# Patient Record
Sex: Female | Born: 1935 | Race: Black or African American | Hispanic: No | Smoking: Never smoker
Health system: Southern US, Community
[De-identification: ages and names within clinical notes are randomized; demographics above are authoritative.]

## PROBLEM LIST (undated history)

## (undated) DIAGNOSIS — I10 Essential (primary) hypertension: Secondary | ICD-10-CM

## (undated) DIAGNOSIS — K219 Gastro-esophageal reflux disease without esophagitis: Secondary | ICD-10-CM

## (undated) DIAGNOSIS — E11319 Type 2 diabetes mellitus with unspecified diabetic retinopathy without macular edema: Secondary | ICD-10-CM

## (undated) DIAGNOSIS — H35039 Hypertensive retinopathy, unspecified eye: Secondary | ICD-10-CM

## (undated) DIAGNOSIS — E119 Type 2 diabetes mellitus without complications: Secondary | ICD-10-CM

## (undated) HISTORY — DX: Hypertensive retinopathy, unspecified eye: H35.039

## (undated) HISTORY — DX: Type 2 diabetes mellitus with unspecified diabetic retinopathy without macular edema: E11.319

## (undated) HISTORY — PX: EYE SURGERY: SHX253

## (undated) HISTORY — PX: CATARACT EXTRACTION: SUR2

## (undated) HISTORY — PX: APPENDECTOMY: SHX54

---

## 2001-03-09 ENCOUNTER — Other Ambulatory Visit: Admission: RE | Admit: 2001-03-09 | Discharge: 2001-03-09 | Payer: Self-pay | Admitting: Obstetrics and Gynecology

## 2001-03-24 ENCOUNTER — Encounter: Payer: Self-pay | Admitting: Obstetrics and Gynecology

## 2001-03-24 ENCOUNTER — Ambulatory Visit (HOSPITAL_COMMUNITY): Admission: RE | Admit: 2001-03-24 | Discharge: 2001-03-24 | Payer: Self-pay | Admitting: Obstetrics and Gynecology

## 2002-09-06 ENCOUNTER — Encounter: Payer: Self-pay | Admitting: Obstetrics and Gynecology

## 2002-09-06 ENCOUNTER — Ambulatory Visit (HOSPITAL_COMMUNITY): Admission: RE | Admit: 2002-09-06 | Discharge: 2002-09-06 | Payer: Self-pay | Admitting: Obstetrics and Gynecology

## 2002-10-22 ENCOUNTER — Other Ambulatory Visit: Admission: RE | Admit: 2002-10-22 | Discharge: 2002-10-22 | Payer: Self-pay | Admitting: Obstetrics and Gynecology

## 2003-11-09 HISTORY — PX: COLONOSCOPY: SHX174

## 2003-12-03 ENCOUNTER — Emergency Department (HOSPITAL_COMMUNITY): Admission: EM | Admit: 2003-12-03 | Discharge: 2003-12-03 | Payer: Self-pay | Admitting: *Deleted

## 2004-01-17 ENCOUNTER — Ambulatory Visit (HOSPITAL_COMMUNITY): Admission: RE | Admit: 2004-01-17 | Discharge: 2004-01-17 | Payer: Self-pay | Admitting: Gastroenterology

## 2005-03-15 ENCOUNTER — Emergency Department (HOSPITAL_COMMUNITY): Admission: EM | Admit: 2005-03-15 | Discharge: 2005-03-16 | Payer: Self-pay | Admitting: Emergency Medicine

## 2006-04-14 ENCOUNTER — Ambulatory Visit (HOSPITAL_COMMUNITY): Admission: RE | Admit: 2006-04-14 | Discharge: 2006-04-14 | Payer: Self-pay | Admitting: Cardiovascular Disease

## 2007-05-08 ENCOUNTER — Ambulatory Visit (HOSPITAL_COMMUNITY): Admission: RE | Admit: 2007-05-08 | Discharge: 2007-05-08 | Payer: Self-pay | Admitting: Cardiovascular Disease

## 2008-04-08 ENCOUNTER — Encounter: Admission: RE | Admit: 2008-04-08 | Discharge: 2008-04-08 | Payer: Self-pay | Admitting: Cardiovascular Disease

## 2008-05-08 ENCOUNTER — Ambulatory Visit (HOSPITAL_COMMUNITY): Admission: RE | Admit: 2008-05-08 | Discharge: 2008-05-08 | Payer: Self-pay | Admitting: Cardiovascular Disease

## 2009-02-10 ENCOUNTER — Encounter: Admission: RE | Admit: 2009-02-10 | Discharge: 2009-02-10 | Payer: Self-pay | Admitting: Cardiovascular Disease

## 2009-05-14 ENCOUNTER — Ambulatory Visit (HOSPITAL_COMMUNITY): Admission: RE | Admit: 2009-05-14 | Discharge: 2009-05-14 | Payer: Self-pay | Admitting: Cardiovascular Disease

## 2009-10-24 ENCOUNTER — Inpatient Hospital Stay (HOSPITAL_COMMUNITY): Admission: EM | Admit: 2009-10-24 | Discharge: 2009-10-27 | Payer: Self-pay | Admitting: Emergency Medicine

## 2010-06-01 ENCOUNTER — Ambulatory Visit (HOSPITAL_COMMUNITY): Admission: RE | Admit: 2010-06-01 | Discharge: 2010-06-01 | Payer: Self-pay | Admitting: Cardiovascular Disease

## 2011-02-08 LAB — RETICULOCYTES: Retic Ct Pct: 1.1 % (ref 0.4–3.1)

## 2011-02-08 LAB — CBC
HCT: 27.5 % — ABNORMAL LOW (ref 36.0–46.0)
HCT: 29.2 % — ABNORMAL LOW (ref 36.0–46.0)
Hemoglobin: 9.5 g/dL — ABNORMAL LOW (ref 12.0–15.0)
MCHC: 33.8 g/dL (ref 30.0–36.0)
MCV: 85.6 fL (ref 78.0–100.0)
Platelets: 189 10*3/uL (ref 150–400)
Platelets: 197 10*3/uL (ref 150–400)
RBC: 3.21 MIL/uL — ABNORMAL LOW (ref 3.87–5.11)
RBC: 3.31 MIL/uL — ABNORMAL LOW (ref 3.87–5.11)
WBC: 5.4 10*3/uL (ref 4.0–10.5)
WBC: 5.8 10*3/uL (ref 4.0–10.5)

## 2011-02-08 LAB — VITAMIN B12: Vitamin B-12: 421 pg/mL (ref 211–911)

## 2011-02-08 LAB — IRON AND TIBC
Iron: 68 ug/dL (ref 42–135)
Saturation Ratios: 30 % (ref 20–55)
UIBC: 157 ug/dL

## 2011-02-08 LAB — GLUCOSE, CAPILLARY
Glucose-Capillary: 113 mg/dL — ABNORMAL HIGH (ref 70–99)
Glucose-Capillary: 141 mg/dL — ABNORMAL HIGH (ref 70–99)
Glucose-Capillary: 144 mg/dL — ABNORMAL HIGH (ref 70–99)
Glucose-Capillary: 63 mg/dL — ABNORMAL LOW (ref 70–99)
Glucose-Capillary: 86 mg/dL (ref 70–99)
Glucose-Capillary: 98 mg/dL (ref 70–99)

## 2011-02-08 LAB — BASIC METABOLIC PANEL
BUN: 5 mg/dL — ABNORMAL LOW (ref 6–23)
CO2: 26 mEq/L (ref 19–32)
Calcium: 9.1 mg/dL (ref 8.4–10.5)
Calcium: 9.8 mg/dL (ref 8.4–10.5)
Chloride: 110 mEq/L (ref 96–112)
Creatinine, Ser: 0.74 mg/dL (ref 0.4–1.2)
GFR calc Af Amer: >60 mL/min (ref >60)
GFR calc Af Amer: >60 mL/min (ref >60)
GFR calc non Af Amer: >60 mL/min (ref >60)
GFR calc non Af Amer: >60 mL/min (ref >60)
Glucose, Bld: 111 mg/dL — ABNORMAL HIGH (ref 70–99)
Potassium: 3.7 mEq/L (ref 3.5–5.1)
Sodium: 138 mEq/L (ref 135–145)
Sodium: 140 mEq/L (ref 135–145)

## 2011-03-26 NOTE — Op Note (Signed)
NAME:  Michaela Herrera, Michaela Herrera                         ACCOUNT NO.:  [id number]   MEDICAL RECORD NO.:  [id number]                   PATIENT TYPE:  AMB   LOCATION:  ENDO                                 FACILITY:  [facility]   PHYSICIAN:  Anselmo Rod, M.D.               DATE OF BIRTH:  1928-10-05   DATE OF PROCEDURE:  01/17/2004  DATE OF DISCHARGE:                                 OPERATIVE REPORT   PROCEDURE:  Screening colonoscopy.   ENDOSCOPIST:  Charna Elizabeth, M.D.   INSTRUMENT USED:  Olympus video colonoscope.   INDICATIONS FOR PROCEDURE:  77 year old [place] American female with a  family history of adenomatous polyps removed in the past.  Rule out  recurrent polyps.   PREPROCEDURE PREPARATION:  Informed consent was obtained from the patient.  The patient was fasted for eight hours prior to the procedure and prepped  with a bottle of magnesium citrate and a gallon of GoLYTELY the night prior  to the procedure.   PREPROCEDURE PHYSICAL:  Patient with stable vital signs.  Neck supple.  Chest clear to auscultation.  S1 and S2 regular.  Abdomen soft with normal  bowel sounds.   DESCRIPTION OF PROCEDURE:  The patient was placed in the left lateral  decubitus position, sedated with 60 mg of Demerol and 6 mg Versed in slow  incremental doses.  Once the patient was adequately sedated, maintained on  low flow oxygen, continuous cardiac monitoring, the Olympus video  colonoscope was advanced from the rectum to the cecum without difficulty.  The appendiceal orifice and ileocecal valve were visualized and  photographed.  No masses or polyps were seen.  A residual pill was seen in  the sigmoid colon.  Retroflexion in the rectum revealed no abnormalities.   IMPRESSION:  Normal colonoscopy to the cecum, no masses or polyps seen.   RECOMMENDATIONS:  1. Repeat CRC screening recommended in the next five years unless the     patient develops any abnormal symptoms     in the interim.  2. Continue a  high fiber diet with liberal fluid intake.  3. Outpatient follow up as need arises in the future.                                               Anselmo Rod, M.D.    JNM/MEDQ  D:  01/17/2004  T:  01/18/2004  Job:  [id number]   cc:   Ricki Rodriguez, M.D.  [address]  [place] [address]

## 2011-05-20 ENCOUNTER — Other Ambulatory Visit (HOSPITAL_COMMUNITY): Payer: Self-pay | Admitting: Cardiovascular Disease

## 2011-05-20 DIAGNOSIS — Z1231 Encounter for screening mammogram for malignant neoplasm of breast: Secondary | ICD-10-CM

## 2011-06-03 ENCOUNTER — Ambulatory Visit (HOSPITAL_COMMUNITY)
Admission: RE | Admit: 2011-06-03 | Discharge: 2011-06-03 | Disposition: A | Discharge status: Home / Self Care | Payer: Medicare Other | Source: Ambulatory Visit | Attending: Cardiovascular Disease | Admitting: Cardiovascular Disease

## 2011-06-03 DIAGNOSIS — Z1231 Encounter for screening mammogram for malignant neoplasm of breast: Secondary | ICD-10-CM

## 2012-04-24 ENCOUNTER — Other Ambulatory Visit: Payer: Self-pay | Admitting: Cardiovascular Disease

## 2012-04-24 ENCOUNTER — Ambulatory Visit
Admission: RE | Admit: 2012-04-24 | Discharge: 2012-04-24 | Disposition: A | Discharge status: Home / Self Care | Payer: Medicare Other | Source: Ambulatory Visit | Attending: Cardiovascular Disease | Admitting: Cardiovascular Disease

## 2012-04-24 DIAGNOSIS — R05 Cough: Secondary | ICD-10-CM

## 2012-05-31 ENCOUNTER — Other Ambulatory Visit (HOSPITAL_COMMUNITY): Payer: Self-pay | Admitting: Cardiovascular Disease

## 2012-05-31 DIAGNOSIS — Z1231 Encounter for screening mammogram for malignant neoplasm of breast: Secondary | ICD-10-CM

## 2012-06-19 ENCOUNTER — Ambulatory Visit (HOSPITAL_COMMUNITY)
Admission: RE | Admit: 2012-06-19 | Discharge: 2012-06-19 | Disposition: A | Discharge status: Home / Self Care | Payer: Medicare Other | Source: Ambulatory Visit | Attending: Cardiovascular Disease | Admitting: Cardiovascular Disease

## 2012-06-19 DIAGNOSIS — Z1231 Encounter for screening mammogram for malignant neoplasm of breast: Secondary | ICD-10-CM | POA: Insufficient documentation

## 2013-08-16 ENCOUNTER — Other Ambulatory Visit (HOSPITAL_COMMUNITY): Payer: Self-pay | Admitting: Cardiovascular Disease

## 2013-08-16 DIAGNOSIS — Z1231 Encounter for screening mammogram for malignant neoplasm of breast: Secondary | ICD-10-CM

## 2013-08-29 ENCOUNTER — Ambulatory Visit (HOSPITAL_COMMUNITY)
Admission: RE | Admit: 2013-08-29 | Discharge: 2013-08-29 | Disposition: A | Discharge status: Home / Self Care | Payer: Medicare Other | Source: Ambulatory Visit | Attending: Cardiovascular Disease | Admitting: Cardiovascular Disease

## 2013-08-29 DIAGNOSIS — Z1231 Encounter for screening mammogram for malignant neoplasm of breast: Secondary | ICD-10-CM | POA: Insufficient documentation

## 2014-08-07 ENCOUNTER — Other Ambulatory Visit (HOSPITAL_COMMUNITY): Payer: Self-pay | Admitting: Cardiovascular Disease

## 2014-08-07 DIAGNOSIS — Z1231 Encounter for screening mammogram for malignant neoplasm of breast: Secondary | ICD-10-CM

## 2014-09-02 ENCOUNTER — Ambulatory Visit (HOSPITAL_COMMUNITY)
Admission: RE | Admit: 2014-09-02 | Discharge: 2014-09-02 | Disposition: A | Discharge status: Home / Self Care | Payer: Medicare HMO | Source: Ambulatory Visit | Attending: Cardiovascular Disease | Admitting: Cardiovascular Disease

## 2014-09-02 DIAGNOSIS — Z1231 Encounter for screening mammogram for malignant neoplasm of breast: Secondary | ICD-10-CM | POA: Diagnosis present

## 2015-07-28 ENCOUNTER — Other Ambulatory Visit: Payer: Self-pay | Admitting: Cardiovascular Disease

## 2015-07-28 ENCOUNTER — Ambulatory Visit
Admission: RE | Admit: 2015-07-28 | Discharge: 2015-07-28 | Disposition: A | Discharge status: Home / Self Care | Payer: Medicare HMO | Source: Ambulatory Visit | Attending: Cardiovascular Disease | Admitting: Cardiovascular Disease

## 2015-07-28 DIAGNOSIS — R609 Edema, unspecified: Secondary | ICD-10-CM

## 2015-07-28 DIAGNOSIS — R52 Pain, unspecified: Secondary | ICD-10-CM

## 2015-12-15 ENCOUNTER — Observation Stay (HOSPITAL_COMMUNITY)
Admission: AD | Admit: 2015-12-15 | Discharge: 2015-12-17 | Disposition: A | Discharge status: Home / Self Care | Payer: Medicare HMO | Source: Ambulatory Visit | Attending: Cardiovascular Disease | Admitting: Cardiovascular Disease

## 2015-12-15 ENCOUNTER — Inpatient Hospital Stay (HOSPITAL_COMMUNITY): Payer: Medicare HMO

## 2015-12-15 DIAGNOSIS — Z79899 Other long term (current) drug therapy: Secondary | ICD-10-CM | POA: Insufficient documentation

## 2015-12-15 DIAGNOSIS — Z8673 Personal history of transient ischemic attack (TIA), and cerebral infarction without residual deficits: Secondary | ICD-10-CM | POA: Insufficient documentation

## 2015-12-15 DIAGNOSIS — R55 Syncope and collapse: Principal | ICD-10-CM | POA: Insufficient documentation

## 2015-12-15 DIAGNOSIS — I671 Cerebral aneurysm, nonruptured: Secondary | ICD-10-CM | POA: Insufficient documentation

## 2015-12-15 DIAGNOSIS — E1165 Type 2 diabetes mellitus with hyperglycemia: Secondary | ICD-10-CM | POA: Diagnosis not present

## 2015-12-15 DIAGNOSIS — Z23 Encounter for immunization: Secondary | ICD-10-CM | POA: Insufficient documentation

## 2015-12-15 DIAGNOSIS — R269 Unspecified abnormalities of gait and mobility: Secondary | ICD-10-CM | POA: Diagnosis not present

## 2015-12-15 DIAGNOSIS — E860 Dehydration: Secondary | ICD-10-CM | POA: Diagnosis not present

## 2015-12-15 DIAGNOSIS — R0602 Shortness of breath: Secondary | ICD-10-CM

## 2015-12-15 DIAGNOSIS — E785 Hyperlipidemia, unspecified: Secondary | ICD-10-CM | POA: Insufficient documentation

## 2015-12-15 DIAGNOSIS — I10 Essential (primary) hypertension: Secondary | ICD-10-CM | POA: Diagnosis present

## 2015-12-15 DIAGNOSIS — G319 Degenerative disease of nervous system, unspecified: Secondary | ICD-10-CM | POA: Insufficient documentation

## 2015-12-15 DIAGNOSIS — E0865 Diabetes mellitus due to underlying condition with hyperglycemia: Secondary | ICD-10-CM | POA: Diagnosis present

## 2015-12-15 HISTORY — DX: Essential (primary) hypertension: I10

## 2015-12-15 HISTORY — DX: Type 2 diabetes mellitus without complications: E11.9

## 2015-12-15 HISTORY — DX: Gastro-esophageal reflux disease without esophagitis: K21.9

## 2015-12-15 LAB — CBC WITH DIFFERENTIAL/PLATELET
BASOS PCT: 0 %
Basophils Absolute: 0 10*3/uL (ref 0.0–0.1)
EOS ABS: 0.3 10*3/uL (ref 0.0–0.7)
Eosinophils Relative: 3 %
HCT: 36.2 % (ref 36.0–46.0)
HEMOGLOBIN: 11.8 g/dL — AB (ref 12.0–15.0)
LYMPHS ABS: 2.8 10*3/uL (ref 0.7–4.0)
Lymphocytes Relative: 25 %
MCH: 28.2 pg (ref 26.0–34.0)
MCHC: 32.6 g/dL (ref 30.0–36.0)
MCV: 86.6 fL (ref 78.0–100.0)
Monocytes Absolute: 0.7 10*3/uL (ref 0.1–1.0)
Monocytes Relative: 6 %
NEUTROS PCT: 66 %
Neutro Abs: 7.6 10*3/uL (ref 1.7–7.7)
PLATELETS: 267 10*3/uL (ref 150–400)
RBC: 4.18 MIL/uL (ref 3.87–5.11)
RDW: 13.7 % (ref 11.5–15.5)
WBC: 11.4 10*3/uL — AB (ref 4.0–10.5)

## 2015-12-15 LAB — BASIC METABOLIC PANEL
Anion gap: 15 (ref 5–15)
BUN: 15 mg/dL (ref 6–20)
CHLORIDE: 98 mmol/L — AB (ref 101–111)
CO2: 28 mmol/L (ref 22–32)
CREATININE: 1.13 mg/dL — AB (ref 0.44–1.00)
Calcium: 11.7 mg/dL — ABNORMAL HIGH (ref 8.9–10.3)
GFR calc non Af Amer: 43 mL/min — ABNORMAL LOW (ref >60)
GFR, EST AFRICAN AMERICAN: 50 mL/min — AB (ref >60)
Glucose, Bld: 191 mg/dL — ABNORMAL HIGH (ref 65–99)
Potassium: 3.6 mmol/L (ref 3.5–5.1)
SODIUM: 141 mmol/L (ref 135–145)

## 2015-12-15 LAB — GLUCOSE, CAPILLARY: GLUCOSE-CAPILLARY: 169 mg/dL — AB (ref 65–99)

## 2015-12-15 MED ORDER — LOSARTAN POTASSIUM 50 MG PO TABS
100.0000 mg | ORAL_TABLET | Freq: Every day | ORAL | Status: DC
  Administered 2015-12-16 – 2015-12-17 (×2): 100 mg via ORAL
  Filled 2015-12-15 (×2): qty 2

## 2015-12-15 MED ORDER — ISOSORBIDE MONONITRATE ER 60 MG PO TB24
60.0000 mg | ORAL_TABLET | Freq: Every day | ORAL | Status: DC
  Administered 2015-12-16 – 2015-12-17 (×2): 60 mg via ORAL
  Filled 2015-12-15 (×2): qty 1

## 2015-12-15 MED ORDER — SODIUM CHLORIDE 0.9% FLUSH
3.0000 mL | Freq: Two times a day (BID) | INTRAVENOUS | Status: DC
  Administered 2015-12-17: 3 mL via INTRAVENOUS

## 2015-12-15 MED ORDER — SODIUM CHLORIDE 0.9 % IV SOLN
INTRAVENOUS | Status: DC
  Administered 2015-12-16: 1000 mL via INTRAVENOUS

## 2015-12-15 MED ORDER — DOCUSATE SODIUM 100 MG PO CAPS
100.0000 mg | ORAL_CAPSULE | Freq: Two times a day (BID) | ORAL | Status: DC
  Administered 2015-12-15 – 2015-12-17 (×4): 100 mg via ORAL
  Filled 2015-12-15 (×4): qty 1

## 2015-12-15 MED ORDER — AMLODIPINE BESYLATE 5 MG PO TABS
5.0000 mg | ORAL_TABLET | Freq: Two times a day (BID) | ORAL | Status: DC
  Administered 2015-12-15 – 2015-12-17 (×4): 5 mg via ORAL
  Filled 2015-12-15 (×4): qty 1

## 2015-12-15 MED ORDER — LINAGLIPTIN 5 MG PO TABS
5.0000 mg | ORAL_TABLET | Freq: Every day | ORAL | Status: DC
  Administered 2015-12-16 – 2015-12-17 (×2): 5 mg via ORAL
  Filled 2015-12-15 (×2): qty 1

## 2015-12-15 MED ORDER — HEPARIN SODIUM (PORCINE) 5000 UNIT/ML IJ SOLN
5000.0000 [IU] | Freq: Three times a day (TID) | INTRAMUSCULAR | Status: DC
  Administered 2015-12-15 – 2015-12-16 (×4): 5000 [IU] via SUBCUTANEOUS
  Filled 2015-12-15 (×4): qty 1

## 2015-12-15 MED ORDER — ONDANSETRON HCL 4 MG/2ML IJ SOLN
4.0000 mg | Freq: Four times a day (QID) | INTRAMUSCULAR | Status: DC | PRN
Start: 2015-12-15 — End: 2015-12-17

## 2015-12-15 MED ORDER — ONDANSETRON HCL 4 MG PO TABS: 4.0000 mg | ORAL_TABLET | Freq: Four times a day (QID) | ORAL | Status: DC | PRN

## 2015-12-15 MED ORDER — ADULT MULTIVITAMIN W/MINERALS CH
1.0000 | ORAL_TABLET | Freq: Every day | ORAL | Status: DC
  Administered 2015-12-16 – 2015-12-17 (×2): 1 via ORAL
  Filled 2015-12-15 (×2): qty 1

## 2015-12-15 MED ORDER — INSULIN ASPART 100 UNIT/ML ~~LOC~~ SOLN
0.0000 [IU] | Freq: Three times a day (TID) | SUBCUTANEOUS | Status: DC
  Administered 2015-12-16 – 2015-12-17 (×2): 2 [IU] via SUBCUTANEOUS

## 2015-12-15 MED ORDER — POTASSIUM CHLORIDE CRYS ER 10 MEQ PO TBCR
10.0000 meq | EXTENDED_RELEASE_TABLET | Freq: Every day | ORAL | Status: DC
  Administered 2015-12-16 – 2015-12-17 (×2): 10 meq via ORAL
  Filled 2015-12-15 (×2): qty 1

## 2015-12-15 MED ORDER — METOPROLOL TARTRATE 50 MG PO TABS
50.0000 mg | ORAL_TABLET | Freq: Two times a day (BID) | ORAL | Status: DC
  Administered 2015-12-15 – 2015-12-17 (×4): 50 mg via ORAL
  Filled 2015-12-15 (×4): qty 1

## 2015-12-15 MED ORDER — GLIPIZIDE ER 5 MG PO TB24
5.0000 mg | ORAL_TABLET | Freq: Two times a day (BID) | ORAL | Status: DC
  Administered 2015-12-15 – 2015-12-17 (×4): 5 mg via ORAL
  Filled 2015-12-15 (×5): qty 1

## 2015-12-15 MED ORDER — CLONIDINE HCL 0.2 MG PO TABS
0.2000 mg | ORAL_TABLET | Freq: Two times a day (BID) | ORAL | Status: DC
  Administered 2015-12-15 – 2015-12-17 (×4): 0.2 mg via ORAL
  Filled 2015-12-15 (×4): qty 1

## 2015-12-15 MED ORDER — METFORMIN HCL 500 MG PO TABS
1000.0000 mg | ORAL_TABLET | Freq: Two times a day (BID) | ORAL | Status: DC
  Administered 2015-12-16 – 2015-12-17 (×3): 1000 mg via ORAL
  Filled 2015-12-15 (×3): qty 2

## 2015-12-15 NOTE — H&P (Signed)
Referring Physician:  CHAUNICE OBIE is an 82 y.o. female.                       Chief Complaint: Syncopal episode  HPI: 82 year old female with diabetes mellitus, type II and hypertension had near syncopal episode without vision or speech disturbance. Has chronic generalized weakness and gait disturbance for few years. Blood sugar elevated to over 300 mg.  in office one to two hour post meal.  Past medical history: Hypertension, Type II diabetes mellitus. No smoking or alcohol intake. No hyperlipidemia. No known MI.   Past surgical history: Colonoscopy-01/17/2004-Normal. Appendectomy-70 years ago. Bilateral cataract surgery 03/13/06.  Family history: Mom died of old age. Not in contact with dad. No brother. One sister age 45, living with liver cancer.   Personal: Widowed. 35 son 26 year old has hypertension and one daughter 3 year old.  Social History:  No tobacco, alcohol, and drug use.  Allergies: No Known Allergies  Medications Prior to Admission  Medication Sig Dispense Refill  . amLODipine (NORVASC) 5 MG tablet Take 5 mg by mouth 2 (two) times daily.  6  . b complex vitamins tablet Take 1 tablet by mouth daily.    . Cholecalciferol (VITAMIN D PO) Take 1 tablet by mouth daily.    . cloNIDine (CATAPRES) 0.2 MG tablet Take 0.2 mg by mouth 2 (two) times daily.  5  . furosemide (LASIX) 20 MG tablet Take 20 mg by mouth 2 (two) times daily.  3  . glipiZIDE (GLUCOTROL XL) 5 MG 24 hr tablet Take 5 mg by mouth 2 (two) times daily.  3  . isosorbide mononitrate (IMDUR) 60 MG 24 hr tablet Take 60 mg by mouth daily.  5  . losartan (COZAAR) 100 MG tablet Take 100 mg by mouth daily.  2  . metFORMIN (GLUCOPHAGE) 500 MG tablet Take 1,000 mg by mouth 2 (two) times daily with a meal.    . metoprolol (LOPRESSOR) 50 MG tablet Take 50 mg by mouth 2 (two) times daily.  5  . potassium chloride (K-DUR,KLOR-CON) 10 MEQ tablet Take 10 mEq by mouth daily.    . sitaGLIPtin (JANUVIA) 100 MG tablet Take 100 mg by  mouth daily.      No results found for this or any previous visit (from the past 48 hour(s)). No results found.  Review Of Systems No hearing loss. No weight loss.  Wears reading glasses, has bilateral lens implants. No chest pain or shortness of breath. No asthma or pneumonia.  No GI bleed. No nausea, vomiting or diarrhea. No hepatitis or blood transfusion No kidney stone or GU bleed. No stroke, seizures. + dizziness + gait disturbance. No psychiatric admission. Positive arthritis. No skin rash.  Blood pressure 152/59, pulse 73, temperature 99.1 F (37.3 C), temperature source Oral, resp. rate 21, height  (1.626 m), weight 62.052 kg (136 lb 12.8 oz), SpO2 96 %. Physical Exam: HEENT: [place]/AT. Wears glasses to read. Brown eyes. Bilateral lens implants. Conj-pink, Sclera-white. Mid line pink and moist tongue. Neck: No JVD. No bruit. 60 % ROM. Lungs: Clear, bil. Heart: Normal S1 and S2. III/VI systolic murmur. Abdomen: Soft and non-tender. Ext; No edema, cyanosis or clubbing.  CNS: Bil. Equal grips. Moves all four extremities. Leans on right side on walking with cane. Skin: warm and dry.  Assessment/Plan Syncope Hypertension DM, II with hyperglycemia Hyperlipidemia Mitral regurgitation Gait abnormality  Admit. IV fluid for early dehydration. MRI/MRA brain Echocardiogram  Partial DNR.  Ricki Rodriguez, MD  12/15/2015, 6:22 PM

## 2015-12-16 ENCOUNTER — Encounter (HOSPITAL_COMMUNITY): Payer: Self-pay | Admitting: General Practice

## 2015-12-16 ENCOUNTER — Inpatient Hospital Stay (HOSPITAL_COMMUNITY): Payer: Medicare HMO

## 2015-12-16 ENCOUNTER — Other Ambulatory Visit (HOSPITAL_COMMUNITY): Payer: Medicare HMO

## 2015-12-16 DIAGNOSIS — R55 Syncope and collapse: Secondary | ICD-10-CM | POA: Diagnosis not present

## 2015-12-16 LAB — BASIC METABOLIC PANEL
ANION GAP: 11 (ref 5–15)
BUN: 14 mg/dL (ref 6–20)
CALCIUM: 11.1 mg/dL — AB (ref 8.9–10.3)
CHLORIDE: 104 mmol/L (ref 101–111)
CO2: 27 mmol/L (ref 22–32)
Creatinine, Ser: 0.96 mg/dL (ref 0.44–1.00)
GFR calc Af Amer: >60 mL/min (ref >60)
GFR calc non Af Amer: 52 mL/min — ABNORMAL LOW (ref >60)
GLUCOSE: 166 mg/dL — AB (ref 65–99)
Potassium: 3.7 mmol/L (ref 3.5–5.1)
Sodium: 142 mmol/L (ref 135–145)

## 2015-12-16 LAB — GLUCOSE, CAPILLARY
GLUCOSE-CAPILLARY: 196 mg/dL — AB (ref 65–99)
GLUCOSE-CAPILLARY: 169 mg/dL — AB (ref 65–99)
Glucose-Capillary: 148 mg/dL — ABNORMAL HIGH (ref 65–99)
Glucose-Capillary: 96 mg/dL (ref 65–99)

## 2015-12-16 LAB — CBC
HEMATOCRIT: 34.7 % — AB (ref 36.0–46.0)
HEMOGLOBIN: 11.1 g/dL — AB (ref 12.0–15.0)
MCH: 28 pg (ref 26.0–34.0)
MCHC: 32 g/dL (ref 30.0–36.0)
MCV: 87.4 fL (ref 78.0–100.0)
Platelets: 263 10*3/uL (ref 150–400)
RBC: 3.97 MIL/uL (ref 3.87–5.11)
RDW: 13.7 % (ref 11.5–15.5)
WBC: 9.5 10*3/uL (ref 4.0–10.5)

## 2015-12-16 MED ORDER — INFLUENZA VAC SPLIT QUAD 0.5 ML IM SUSY
0.5000 mL | PREFILLED_SYRINGE | INTRAMUSCULAR | Status: AC
  Administered 2015-12-17: 0.5 mL via INTRAMUSCULAR

## 2015-12-16 NOTE — Progress Notes (Signed)
Ref: Birdie Riddle, MD   Subjective:  Improving sugar control. MRI head positive for atrophy, old cerebellar infarcts + moderate vascular disease, etc. Echocardiogram with good LV systolic function.  Objective:  Vital Signs in the last 24 hours: Temp:  [97.4 F (36.3 C)-98.7 F (37.1 C)] 98.7 F (37.1 C) (02/07 1210) Pulse Rate:  [56-64] 56 (02/07 1210) Cardiac Rhythm:  [-] Sinus bradycardia (02/07 0817) Resp:  [16-21] 21 (02/07 1210) BP: (119-147)/(58-75) 119/58 mmHg (02/07 1210) SpO2:  [93 %-95 %] 93 % (02/07 1210) Weight:  [63.64 kg (140 lb 4.8 oz)] 63.64 kg (140 lb 4.8 oz) (02/07 0252)  Physical Exam: BP Readings from Last 1 Encounters:  12/16/15 119/58     Wt Readings from Last 1 Encounters:  12/16/15 63.64 kg (140 lb 4.8 oz)    Weight change:   HEENT: [place]/AT, Eyes-Brown, PERL, EOMI, Conjunctiva-Pink, Sclera-Non-icteric Neck: No JVD, No bruit, Trachea midline. Lungs:  Clear, Bilateral. Cardiac:  Regular rhythm, normal S1 and S2, no S3.  Abdomen:  Soft, non-tender. Extremities:  No edema present. No cyanosis. No clubbing. CNS: AxOx3, Cranial nerves grossly intact, moves all 4 extremities. Right handed. Skin: Warm and dry.   Intake/Output from previous day: 02/06 0701 - 02/07 0700 In: 0  Out: 950 [Urine:950]    Lab Results: BMET    Component Value Date/Time   NA 142 12/16/2015 0624   NA 141 12/15/2015 1833   NA 141 10/27/2009 0610   K 3.7 12/16/2015 0624   K 3.6 12/15/2015 1833   K 3.7 10/27/2009 0610   CL 104 12/16/2015 0624   CL 98* 12/15/2015 1833   CL 112 10/27/2009 0610   CO2 27 12/16/2015 0624   CO2 28 12/15/2015 1833   CO2 25 10/27/2009 0610   GLUCOSE 166* 12/16/2015 0624   GLUCOSE 191* 12/15/2015 1833   GLUCOSE 132* 10/27/2009 0610   BUN 14 12/16/2015 0624   BUN 15 12/15/2015 1833   BUN 5* 10/27/2009 0610   CREATININE 0.96 12/16/2015 0624   CREATININE 1.13* 12/15/2015 1833   CREATININE 0.74 10/27/2009 0610   CALCIUM 11.1* 12/16/2015 0624    CALCIUM 11.7* 12/15/2015 1833   CALCIUM 9.5 10/27/2009 0610   GFRNONAA 52* 12/16/2015 0624   GFRNONAA 43* 12/15/2015 1833   GFRNONAA >60 10/27/2009 0610   GFRAA >60 12/16/2015 0624   GFRAA 50* 12/15/2015 1833   GFRAA  10/27/2009 0610    >60        The eGFR has been calculated using the MDRD equation. This calculation has not been validated in all clinical situations. eGFR's persistently <60 mL/min signify possible Chronic Kidney Disease.   CBC    Component Value Date/Time   WBC 9.5 12/16/2015 0624   RBC 3.97 12/16/2015 0624   RBC 3.38* 10/27/2009 0610   HGB 11.1* 12/16/2015 0624   HCT 34.7* 12/16/2015 0624   PLT 263 12/16/2015 0624   MCV 87.4 12/16/2015 0624   MCH 28.0 12/16/2015 0624   MCHC 32.0 12/16/2015 0624   RDW 13.7 12/16/2015 0624   LYMPHSABS 2.8 12/15/2015 1833   MONOABS 0.7 12/15/2015 1833   EOSABS 0.3 12/15/2015 1833   BASOSABS 0.0 12/15/2015 1833   HEPATIC Function Panel No results for input(s): PROT in the last 8760 hours.  Invalid input(s):  ALBUMIN,  AST,  ALT,  ALKPHOS,  BILIDIR,  IBILI HEMOGLOBIN A1C No components found for: HGA1C,  MPG CARDIAC ENZYMES No results found for: CKTOTAL, CKMB, CKMBINDEX, TROPONINI BNP No results for input(s): PROBNP in  the last 8760 hours. TSH No results for input(s): TSH in the last 8760 hours. CHOLESTEROL No results for input(s): CHOL in the last 8760 hours.  Scheduled Meds: . amLODipine  5 mg Oral BID  . cloNIDine  0.2 mg Oral BID  . docusate sodium  100 mg Oral BID  . glipiZIDE  5 mg Oral BID  . heparin  5,000 Units Subcutaneous 3 times per day  . [START ON 12/17/2015] Influenza vac split quadrivalent PF  0.5 mL Intramuscular Tomorrow-1000  . insulin aspart  0-9 Units Subcutaneous TID WC  . isosorbide mononitrate  60 mg Oral Daily  . linagliptin  5 mg Oral Daily  . losartan  100 mg Oral Daily  . metFORMIN  1,000 mg Oral BID WC  . metoprolol  50 mg Oral BID  . multivitamin with minerals  1 tablet Oral  Daily  . potassium chloride  10 mEq Oral Daily  . sodium chloride flush  3 mL Intravenous Q12H   Continuous Infusions: . sodium chloride 1,000 mL (12/16/15 1703)   PRN Meds:.ondansetron **OR** ondansetron (ZOFRAN) IV  Assessment/Plan: Syncope Hypertension DM, II with hyperglycemia Hyperlipidemia Mitral regurgitation Gait abnormality Old cerebellar infarcts  Increase activity. Agrees to [employer] with seat use. As needed regular insulin use. Increase activity.     LOS: 1 day    Dixie Dials  MD  12/16/2015, 6:46 PM

## 2015-12-16 NOTE — Progress Notes (Signed)
Echocardiogram 2D Echocardiogram has been performed.  12/16/2015 4:43 PM Gertie Fey, RVT, RDCS, RDMS

## 2015-12-17 DIAGNOSIS — R55 Syncope and collapse: Secondary | ICD-10-CM | POA: Diagnosis not present

## 2015-12-17 LAB — GLUCOSE, CAPILLARY
GLUCOSE-CAPILLARY: 164 mg/dL — AB (ref 65–99)
GLUCOSE-CAPILLARY: 120 mg/dL — AB (ref 65–99)

## 2015-12-17 MED ORDER — INSULIN ASPART 100 UNIT/ML ~~LOC~~ SOLN: 0.0000 [IU] | Freq: Three times a day (TID) | SUBCUTANEOUS | Status: DC

## 2015-12-17 MED ORDER — FUROSEMIDE 20 MG PO TABS: 20.0000 mg | ORAL_TABLET | Freq: Every day | ORAL | Status: DC

## 2015-12-17 NOTE — Progress Notes (Signed)
Patient was discharged home by MD order; discharged instructions  review and give to patient and her daughter with care notes; IV DIC; skin intact; patient will be escorted to the car by nurse tech via wheelchair.  

## 2015-12-17 NOTE — Evaluation (Signed)
Physical Therapy Evaluation Patient Details Name: Michaela Herrera MRN: [id number] DOB: 1928-02-26 Today's Date: 12/17/2015   History of Present Illness  pt is an 82 y/o female with h/o HTN, DM, old cerebellar infarcts admitted after presyncopal event with diaphoresis.  Clinical Impression  Pt admitted with/for presyncope.  Pt currently limited functionally due to the problems listed below.  (see problems list.)  Pt will benefit from PT to maximize function and safety to be able to get home safely with available assist of family.     Follow Up Recommendations Home health PT;Supervision for mobility/OOB (up to 24 hours)    Equipment Recommendations  Rolling walker with 5" wheels;3in1 (PT) (tub seat)    Recommendations for Other Services OT consult     Precautions / Restrictions Precautions Precautions: Fall      Mobility  Bed Mobility Overal bed mobility: Needs Assistance Bed Mobility: Supine to Sit     Supine to sit: Min assist     General bed mobility comments: cued and assisted up onto R elbow.  Transfers Overall transfer level: Needs assistance   Transfers: Sit to/from Stand Sit to Stand: Min guard         General transfer comment: cues for hand placement and transfer safety with the RW  Ambulation/Gait Ambulation/Gait assistance: Min guard Ambulation Distance (Feet): 170 Feet Assistive device: Rolling walker (2 wheeled) Gait Pattern/deviations: Step-through pattern Gait velocity: slower Gait velocity interpretation: Below normal speed for age/gender General Gait Details: steady with RW.  cued for RW safety and general transfer safety.  Stairs            Wheelchair Mobility    Modified Rankin (Stroke Patients Only)       Balance Overall balance assessment: Needs assistance Sitting-balance support: No upper extremity supported Sitting balance-Leahy Scale: Fair     Standing balance support: Bilateral upper extremity supported;Single extremity  supported Standing balance-Leahy Scale: Poor Standing balance comment: reliant on assistive device for stability.                             Pertinent Vitals/Pain Pain Assessment: No/denies pain    Home Living Family/patient expects to be discharged to:: Private residence Living Arrangements: Children (son and daughter) Available Help at Discharge: Family;Other (Comment) (up to 24 hours`) Type of Home: House Home Access: Stairs to enter   [employer] of Steps: several Home Layout: One level Home Equipment: Grab bars - tub/shower;Cane - quad      Prior Function Level of Independence: Needs assistance   Gait / Transfers Assistance Needed: independent in home with quad cane, but unsteady  ADL's / Homemaking Assistance Needed: becoming more difficult to get in the tub and wash up.  sometimes choosing to take sponge baths.        Hand Dominance        Extremity/Trunk Assessment   Upper Extremity Assessment: Overall WFL for tasks assessed;Generalized weakness (some proximal weakness)           Lower Extremity Assessment: Overall WFL for tasks assessed;Generalized weakness (proximal weakness otherwise functional strength)         Communication   Communication: No difficulties  Cognition Arousal/Alertness: Awake/alert Behavior During Therapy: WFL for tasks assessed/performed Overall Cognitive Status: Within Functional Limits for tasks assessed (not a great historian though)       Memory: Decreased short-term memory              General Comments  Exercises        Assessment/Plan    PT Assessment Patient needs continued PT services  PT Diagnosis Generalized weakness   PT Problem List Decreased activity tolerance;Decreased strength;Decreased balance;Decreased mobility;Decreased knowledge of use of DME  PT Treatment Interventions Gait training;Functional mobility training;Therapeutic activities;Therapeutic exercise;Balance  training;Patient/family education;DME instruction;Stair training   PT Goals (Current goals can be found in the Care Plan section) Acute Rehab PT Goals Patient Stated Goal: home safe and independent with AD PT Goal Formulation: With patient Time For Goal Achievement: 12/24/15 Potential to Achieve Goals: Good    Frequency Min 3X/week   Barriers to discharge        Co-evaluation               End of Session   Activity Tolerance: Patient tolerated treatment well Patient left: in chair;with call bell/phone within reach;with family/visitor present Nurse Communication: Mobility status    Functional Assessment Tool Used: clinical judgement Functional Limitation: Mobility: Walking and moving around Mobility: Walking and Moving Around Current Status (Z6109): At least 1 percent but less than 20 percent impaired, limited or restricted Mobility: Walking and Moving Around Goal Status [phone]): At least 1 percent but less than 20 percent impaired, limited or restricted    Time: 1246-1318 PT Time Calculation (min) (ACUTE ONLY): 32 min   Charges:   PT Evaluation $PT Eval Moderate Complexity: 1 Procedure PT Treatments $Gait Training: 8-22 mins   PT G Codes:   PT G-Codes **NOT FOR INPATIENT CLASS** Functional Assessment Tool Used: clinical judgement Functional Limitation: Mobility: Walking and moving around Mobility: Walking and Moving Around Current Status (U9811): At least 1 percent but less than 20 percent impaired, limited or restricted Mobility: Walking and Moving Around Goal Status [phone]): At least 1 percent but less than 20 percent impaired, limited or restricted    Jahnavi Muratore, Eliseo Gum 12/17/2015, 1:41 PM 12/17/2015  Shiloh Bing, PT [phone]  (pager)

## 2015-12-17 NOTE — Discharge Summary (Signed)
Physician Discharge Summary  Patient ID: Michaela Herrera MRN: [id number] DOB/AGE: 1928-05-26 82 y.o.  Admit date: 12/15/2015 Discharge date: 12/17/2015  Admission Diagnoses: Syncope Hypertension DM, II with hyperglycemia Hyperlipidemia Mitral regurgitation Gait abnormality  Discharge Diagnoses:  Principal Problem:   Syncope Active Problems:   Diabetes mellitus due to underlying condition with hyperglycemia, without long-term current use of insulin (HCC)   Essential hypertension   DM, II with hyperglycemia   Hyperlipidemia   Mitral regurgitation   Gait abnormality   Old cerebellar infarcts   Hypercalcemia  Discharged Condition: fair  Hospital Course: 82 year old female with diabetes mellitus, type II and hypertension had near syncopal episode without vision or speech disturbance. Has chronic generalized weakness and gait disturbance for few years. Blood sugar elevated to over 300 mg.in office one to two hour post meal. Her echocardiogram showed good LV systolic function. Her MRI/MRA brain showed cerebellar infarcts and brain atrophy. Her blood sugar improved with hydration and Humalog sliding scale coverage. She will use rolling walker for ambulation. She will be followed by me in 1 week.  Consults: Cardiology  Significant Diagnostic Studies: labs: Hgb 11.8, WBC 11.4 without fever. Electrolytes were normal with mildly elevated creatinine and calcium.  EKG-NSR, Left axis deviation and LVH.  Echocardiogram showed LVH, dilated LA and RA and mild MR.  MRI brain showed: Exam is slightly motion degraded. No acute infarct or intracranial hemorrhage. Remote mid posterior left cerebellar infarct. Remote tiny inferior right cerebellar infarct. Moderate chronic small vessel disease. Left posterior frontal -parietal 1 x 0.9 x 0.5 cm calcified meningioma suspected without surrounding vasogenic edema. Global atrophy without hydrocephalus. Cervical spondylotic changes C3-4 with cord  flattening. Opacification mastoid air cells and middle ear cavities bilaterally. Minimal mucosal thickening paranasal sinuses. Nonspecific left parotid 1.1 cm T2 bright lesion deep aspect of the left superficial lobe. This does not have characteristics of a intra parotid lymph node. 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  MRA brainTwo tiny aneurysm superior margin of the right internal carotid artery cavernous segment measuring up to 2 mm. Moderate narrowing left internal carotid artery cavernous segment. Middle cerebral artery and anterior cerebral artery branch vessel mild narrowing and irregularity. Mild narrowing distal left vertebral artery. Mild narrowing and slight irregularity proximal to mid basilar artery. No high-grade stenosis of the distal vertebral arteries or basilar artery. Nonvisualized right posterior inferior cerebellar artery. Poor delineation of the anterior inferior cerebellar arteries. Slightly irregular superior cerebellar arteries bilaterally. Distal branches of the posterior cerebral arteries are narrowed and irregular with narrowing more notable on the right.:   Treatments: IV hydration and insulin: Humalog  Discharge Exam: Blood pressure 140/99, pulse 89, temperature 98.3 F (36.8 C), temperature source Oral, resp. rate 15, height  (1.626 m), weight 64.275 kg (141 lb 11.2 oz), SpO2 92 %. Physical Exam: HEENT: [place]/AT. Wears glasses to read. Brown eyes. Bilateral lens implants. Conj-pink, Sclera-white. Mid line pink and moist tongue. Neck: No JVD. No bruit. 60 % ROM. Lungs: Clear, bil. Heart: Normal S1 and S2. III/VI systolic murmur. Abdomen: Soft and non-tender. Ext; No edema, cyanosis or clubbing.  CNS: Bil. Equal grips. Moves all four extremities. Leans on right side on walking with cane, improves with walker use. Skin: warm and dry.  Disposition:      Medication  List    TAKE these medications        amLODipine 5 MG tablet  Commonly known as:  NORVASC  Take 5 mg by mouth 2 (two) times daily.     b complex vitamins tablet  Take 1 tablet by mouth daily.     cloNIDine 0.2 MG tablet  Commonly known as:  CATAPRES  Take 0.2 mg by mouth 2 (two) times daily.     furosemide 20 MG tablet  Commonly known as:  LASIX  Take 1 tablet (20 mg total) by mouth daily.     glipiZIDE 5 MG 24 hr tablet  Commonly known as:  GLUCOTROL XL  Take 5 mg by mouth 2 (two) times daily.     insulin aspart 100 UNIT/ML injection  Commonly known as:  novoLOG  Inject 0-9 Units into the skin 3 (three) times daily with meals.     isosorbide mononitrate 60 MG 24 hr tablet  Commonly known as:  IMDUR  Take 60 mg by mouth daily.     losartan 100 MG tablet  Commonly known as:  COZAAR  Take 100 mg by mouth daily.     metFORMIN 500 MG tablet  Commonly known as:  GLUCOPHAGE  Take 1,000 mg by mouth 2 (two) times daily with a meal.     metoprolol 50 MG tablet  Commonly known as:  LOPRESSOR  Take 50 mg by mouth 2 (two) times daily.     potassium chloride 10 MEQ tablet  Commonly known as:  K-DUR,KLOR-CON  Take 10 mEq by mouth daily.     sitaGLIPtin 100 MG tablet  Commonly known as:  JANUVIA  Take 100 mg by mouth daily.     VITAMIN D PO  Take 1 tablet by mouth daily.           Follow-up Information    Follow up with [facility], MD. Schedule an appointment as soon as possible for a visit in 1 week.   Specialty:  Cardiology   Contact information:   [address] [place] [address] [phone]       Signed: Ricki Rodriguez 12/17/2015, 9:00 AM

## 2015-12-17 NOTE — Care Management Obs Status (Signed)
MEDICARE OBSERVATION STATUS NOTIFICATION   Patient Details  Name: Michaela Herrera MRN: [id number] Date of Birth: [birth year]   Medicare Observation Status Notification Given:  Yes    Epifanio Lesches, RN 12/17/2015, 2:49 PM

## 2016-01-20 ENCOUNTER — Inpatient Hospital Stay (HOSPITAL_COMMUNITY)
Admission: AD | Admit: 2016-01-20 | Discharge: 2016-01-23 | DRG: 312 | Disposition: A | Discharge status: Home Health | Payer: Medicare HMO | Source: Ambulatory Visit | Attending: Cardiovascular Disease | Admitting: Cardiovascular Disease

## 2016-01-20 ENCOUNTER — Inpatient Hospital Stay (HOSPITAL_COMMUNITY): Payer: Medicare HMO

## 2016-01-20 DIAGNOSIS — Z794 Long term (current) use of insulin: Secondary | ICD-10-CM | POA: Diagnosis not present

## 2016-01-20 DIAGNOSIS — I340 Nonrheumatic mitral (valve) insufficiency: Secondary | ICD-10-CM | POA: Diagnosis present

## 2016-01-20 DIAGNOSIS — F419 Anxiety disorder, unspecified: Secondary | ICD-10-CM | POA: Diagnosis present

## 2016-01-20 DIAGNOSIS — R269 Unspecified abnormalities of gait and mobility: Secondary | ICD-10-CM | POA: Diagnosis present

## 2016-01-20 DIAGNOSIS — K219 Gastro-esophageal reflux disease without esophagitis: Secondary | ICD-10-CM | POA: Diagnosis present

## 2016-01-20 DIAGNOSIS — R55 Syncope and collapse: Secondary | ICD-10-CM | POA: Diagnosis present

## 2016-01-20 DIAGNOSIS — F0390 Unspecified dementia without behavioral disturbance: Secondary | ICD-10-CM | POA: Diagnosis present

## 2016-01-20 DIAGNOSIS — E785 Hyperlipidemia, unspecified: Secondary | ICD-10-CM | POA: Diagnosis present

## 2016-01-20 DIAGNOSIS — Z8673 Personal history of transient ischemic attack (TIA), and cerebral infarction without residual deficits: Secondary | ICD-10-CM

## 2016-01-20 DIAGNOSIS — I10 Essential (primary) hypertension: Secondary | ICD-10-CM | POA: Diagnosis present

## 2016-01-20 DIAGNOSIS — E1165 Type 2 diabetes mellitus with hyperglycemia: Secondary | ICD-10-CM | POA: Diagnosis present

## 2016-01-20 DIAGNOSIS — E0865 Diabetes mellitus due to underlying condition with hyperglycemia: Secondary | ICD-10-CM | POA: Diagnosis present

## 2016-01-20 LAB — CBC WITH DIFFERENTIAL/PLATELET
Basophils Absolute: 0 10*3/uL (ref 0.0–0.1)
Basophils Relative: 0 %
EOS ABS: 0.2 10*3/uL (ref 0.0–0.7)
EOS PCT: 2 %
HCT: 34.6 % — ABNORMAL LOW (ref 36.0–46.0)
Hemoglobin: 11.6 g/dL — ABNORMAL LOW (ref 12.0–15.0)
LYMPHS ABS: 2.5 10*3/uL (ref 0.7–4.0)
LYMPHS PCT: 20 %
MCH: 28.9 pg (ref 26.0–34.0)
MCHC: 33.5 g/dL (ref 30.0–36.0)
MCV: 86.3 fL (ref 78.0–100.0)
MONOS PCT: 3 %
Monocytes Absolute: 0.4 10*3/uL (ref 0.1–1.0)
Neutro Abs: 9 10*3/uL — ABNORMAL HIGH (ref 1.7–7.7)
Neutrophils Relative %: 75 %
PLATELETS: 266 10*3/uL (ref 150–400)
RBC: 4.01 MIL/uL (ref 3.87–5.11)
RDW: 12.8 % (ref 11.5–15.5)
WBC: 12.1 10*3/uL — ABNORMAL HIGH (ref 4.0–10.5)

## 2016-01-20 LAB — COMPREHENSIVE METABOLIC PANEL
ALBUMIN: 3.6 g/dL (ref 3.5–5.0)
ALT: 13 U/L — AB (ref 14–54)
AST: 20 U/L (ref 15–41)
Alkaline Phosphatase: 71 U/L (ref 38–126)
Anion gap: 11 (ref 5–15)
BUN: 20 mg/dL (ref 6–20)
CHLORIDE: 104 mmol/L (ref 101–111)
CO2: 26 mmol/L (ref 22–32)
CREATININE: 1.17 mg/dL — AB (ref 0.44–1.00)
Calcium: 11.7 mg/dL — ABNORMAL HIGH (ref 8.9–10.3)
GFR calc Af Amer: 47 mL/min — ABNORMAL LOW (ref >60)
GFR calc non Af Amer: 41 mL/min — ABNORMAL LOW (ref >60)
GLUCOSE: 208 mg/dL — AB (ref 65–99)
POTASSIUM: 4.4 mmol/L (ref 3.5–5.1)
SODIUM: 141 mmol/L (ref 135–145)
Total Bilirubin: 0.6 mg/dL (ref 0.3–1.2)
Total Protein: 6.2 g/dL — ABNORMAL LOW (ref 6.5–8.1)

## 2016-01-20 LAB — GLUCOSE, CAPILLARY
GLUCOSE-CAPILLARY: 193 mg/dL — AB (ref 65–99)
Glucose-Capillary: 71 mg/dL (ref 65–99)

## 2016-01-20 MED ORDER — METFORMIN HCL 500 MG PO TABS
1000.0000 mg | ORAL_TABLET | Freq: Two times a day (BID) | ORAL | Status: DC
  Administered 2016-01-20 – 2016-01-21 (×3): 1000 mg via ORAL
  Filled 2016-01-20 (×4): qty 2

## 2016-01-20 MED ORDER — INSULIN ASPART 100 UNIT/ML ~~LOC~~ SOLN: 0.0000 [IU] | Freq: Three times a day (TID) | SUBCUTANEOUS | Status: DC

## 2016-01-20 MED ORDER — SODIUM CHLORIDE 0.9% FLUSH
3.0000 mL | Freq: Two times a day (BID) | INTRAVENOUS | Status: DC
Start: 2016-01-20 — End: 2016-01-23
  Administered 2016-01-21 – 2016-01-23 (×4): 3 mL via INTRAVENOUS

## 2016-01-20 MED ORDER — HEPARIN SODIUM (PORCINE) 5000 UNIT/ML IJ SOLN
5000.0000 [IU] | Freq: Three times a day (TID) | INTRAMUSCULAR | Status: DC
  Administered 2016-01-20 – 2016-01-23 (×7): 5000 [IU] via SUBCUTANEOUS
  Filled 2016-01-20 (×7): qty 1

## 2016-01-20 MED ORDER — POTASSIUM CHLORIDE CRYS ER 10 MEQ PO TBCR
10.0000 meq | EXTENDED_RELEASE_TABLET | Freq: Every day | ORAL | Status: DC
  Administered 2016-01-20 – 2016-01-23 (×4): 10 meq via ORAL
  Filled 2016-01-20 (×4): qty 1

## 2016-01-20 MED ORDER — SODIUM CHLORIDE 0.9 % IV SOLN
INTRAVENOUS | Status: DC
  Administered 2016-01-20: 16:00:00 via INTRAVENOUS

## 2016-01-20 MED ORDER — GLIPIZIDE ER 5 MG PO TB24
5.0000 mg | ORAL_TABLET | Freq: Two times a day (BID) | ORAL | Status: DC
  Administered 2016-01-20 – 2016-01-21 (×2): 5 mg via ORAL
  Filled 2016-01-20 (×2): qty 1

## 2016-01-20 MED ORDER — METOPROLOL TARTRATE 50 MG PO TABS
50.0000 mg | ORAL_TABLET | Freq: Two times a day (BID) | ORAL | Status: DC
Start: 2016-01-20 — End: 2016-01-23
  Administered 2016-01-20 – 2016-01-23 (×6): 50 mg via ORAL
  Filled 2016-01-20 (×3): qty 1
  Filled 2016-01-20: qty 2
  Filled 2016-01-20 (×2): qty 1

## 2016-01-20 NOTE — H&P (Signed)
Referring Physician:  Alinda SierrasMargie N Balandran is an [age] y.o. female.                       Chief Complaint: Near syncopy  HPI: 82 year old female with diabetes mellitus, type II, old cerebellar infarcts and hypertension had near syncopal episode without vision or speech disturbance. The event was witnessed in office where patient was unresponsive for 2-3 minutes.. She was made supine on bed for 5 minutes and felt better. Her blood pressure was low at 90/45 and improved to 110/50 with 10 minutes rest. She was not hypoglycemic.  She had similar episode about 5-6 weeks ago.   Past Medical History  Diagnosis Date  . Hypertension   . Diabetes mellitus without complication (HCC)   . GERD (gastroesophageal reflux disease)       Past Surgical History  Procedure Laterality Date  . Cataract extraction    . Colonoscopy  2005  . Appendectomy     Family history: Mom died of old age. Not in contact with dad. No brother. One sister age 80, living with liver cancer.  Personal History: Widowed. 461 son 80 year old has hypertension and one daughter 80 year old.  Social History:  reports that she has never smoked. She has never used smokeless tobacco. She reports that she does not drink alcohol or use illicit drugs.  Allergies: No Known Allergies  Medications Prior to Admission  Medication Sig Dispense Refill  . amLODipine (NORVASC) 5 MG tablet Take 5 mg by mouth 2 (two) times daily.  6  . b complex vitamins tablet Take 1 tablet by mouth daily.    . Cholecalciferol (VITAMIN D PO) Take 1 tablet by mouth daily.    . cloNIDine (CATAPRES) 0.2 MG tablet Take 0.2 mg by mouth 2 (two) times daily.  5  . furosemide (LASIX) 20 MG tablet Take 1 tablet (20 mg total) by mouth daily.    Marland Kitchen. glipiZIDE (GLUCOTROL XL) 5 MG 24 hr tablet Take 5 mg by mouth 2 (two) times daily.  3  . insulin aspart (NOVOLOG) 100 UNIT/ML injection Inject 0-9 Units into the skin 3 (three) times daily with meals. 10 mL 11  . isosorbide mononitrate  (IMDUR) 60 MG 24 hr tablet Take 60 mg by mouth daily.  5  . losartan (COZAAR) 100 MG tablet Take 100 mg by mouth daily.  2  . metFORMIN (GLUCOPHAGE) 500 MG tablet Take 1,000 mg by mouth 2 (two) times daily with a meal.    . metoprolol (LOPRESSOR) 50 MG tablet Take 50 mg by mouth 2 (two) times daily.  5  . potassium chloride (K-DUR,KLOR-CON) 10 MEQ tablet Take 10 mEq by mouth daily.    . sitaGLIPtin (JANUVIA) 100 MG tablet Take 100 mg by mouth daily.      No results found for this or any previous visit (from the past 48 hour(s)). No results found.  Review Of Systems No hearing loss. No weight loss.  Wears reading glasses, has bilateral lens implants. No chest pain or shortness of breath. No asthma or pneumonia.  No GI bleed. No nausea, vomiting or diarrhea. No hepatitis or blood transfusion No kidney stone or GU bleed. No stroke, seizures. + dizziness + gait disturbance. No psychiatric admission. Positive arthritis. No skin rash.  Blood pressure 130/52, pulse 66, temperature 97.5 F (36.4 C), temperature source Oral, resp. rate 18, height 5\' 4"  (1.626 m), weight 63 kg (138 lb 14.2 oz), SpO2 98 %.  Physical Exam: HEENT: [place]/AT. Wears glasses to read. Brown eyes. Bilateral lens implants. Conj-pink, Sclera-white. Mid line pink and moist tongue. Neck: No JVD. No bruit. 60 % ROM. Lungs: Clear, bil. Heart: Normal S1 and S2. III/VI systolic murmur. Abdomen: Soft and non-tender. Ext; No edema, cyanosis or clubbing.  CNS: Bil. Equal grips. Moves all four extremities. Leans on right side on walking with cane. Skin: warm and dry.  Assessment/Plan Syncope Essential hypertension DM, II with hyperglycemia Hyperlipidemia Mitral regurgitation Gait abnormality Old cerebellar infarcts Hypercalcemia  Admit/IV fluids/Labs. Hold antihypertensive medications.  Ricki Rodriguez, MD  01/20/2016, 3:47 PM

## 2016-01-21 ENCOUNTER — Encounter (HOSPITAL_COMMUNITY): Payer: Self-pay | Admitting: *Deleted

## 2016-01-21 LAB — URINALYSIS, ROUTINE W REFLEX MICROSCOPIC
BILIRUBIN URINE: NEGATIVE
Glucose, UA: NEGATIVE mg/dL
HGB URINE DIPSTICK: NEGATIVE
Ketones, ur: NEGATIVE mg/dL
Nitrite: NEGATIVE
PH: 5 (ref 5.0–8.0)
Protein, ur: NEGATIVE mg/dL
SPECIFIC GRAVITY, URINE: 1.012 (ref 1.005–1.030)

## 2016-01-21 LAB — CBC
HCT: 32.2 % — ABNORMAL LOW (ref 36.0–46.0)
Hemoglobin: 10.8 g/dL — ABNORMAL LOW (ref 12.0–15.0)
MCH: 29.1 pg (ref 26.0–34.0)
MCHC: 33.5 g/dL (ref 30.0–36.0)
MCV: 86.8 fL (ref 78.0–100.0)
PLATELETS: 241 10*3/uL (ref 150–400)
RBC: 3.71 MIL/uL — AB (ref 3.87–5.11)
RDW: 12.9 % (ref 11.5–15.5)
WBC: 10.6 10*3/uL — ABNORMAL HIGH (ref 4.0–10.5)

## 2016-01-21 LAB — GLUCOSE, CAPILLARY
GLUCOSE-CAPILLARY: 106 mg/dL — AB (ref 65–99)
GLUCOSE-CAPILLARY: 121 mg/dL — AB (ref 65–99)
GLUCOSE-CAPILLARY: 116 mg/dL — AB (ref 65–99)
GLUCOSE-CAPILLARY: 56 mg/dL — AB (ref 65–99)

## 2016-01-21 LAB — BASIC METABOLIC PANEL
Anion gap: 10 (ref 5–15)
BUN: 17 mg/dL (ref 6–20)
CHLORIDE: 107 mmol/L (ref 101–111)
CO2: 24 mmol/L (ref 22–32)
CREATININE: 1.07 mg/dL — AB (ref 0.44–1.00)
Calcium: 11.2 mg/dL — ABNORMAL HIGH (ref 8.9–10.3)
GFR calc Af Amer: 53 mL/min — ABNORMAL LOW (ref >60)
GFR calc non Af Amer: 46 mL/min — ABNORMAL LOW (ref >60)
GLUCOSE: 171 mg/dL — AB (ref 65–99)
Potassium: 4.1 mmol/L (ref 3.5–5.1)
Sodium: 141 mmol/L (ref 135–145)

## 2016-01-21 LAB — URINE MICROSCOPIC-ADD ON

## 2016-01-21 MED ORDER — CLONIDINE HCL 0.1 MG PO TABS
0.1000 mg | ORAL_TABLET | Freq: Two times a day (BID) | ORAL | Status: DC
  Administered 2016-01-21 – 2016-01-23 (×5): 0.1 mg via ORAL
  Filled 2016-01-21 (×5): qty 1

## 2016-01-21 MED ORDER — LOSARTAN POTASSIUM 25 MG PO TABS
25.0000 mg | ORAL_TABLET | Freq: Every day | ORAL | Status: DC
  Administered 2016-01-21 – 2016-01-22 (×2): 25 mg via ORAL
  Filled 2016-01-21 (×2): qty 1

## 2016-01-21 MED ORDER — GLIPIZIDE ER 5 MG PO TB24
5.0000 mg | ORAL_TABLET | Freq: Every day | ORAL | Status: DC
  Filled 2016-01-21: qty 1

## 2016-01-21 NOTE — Care Management Note (Signed)
Case Management Note  Patient Details  Name: Alinda SierrasMargie N Ratterman MRN: [id number] Date of Birth: 1928/01/01  Subjective/Objective:  82 y.o. F admitted 01/20/2016 for syncopal episode. Awaiting PT evaluation for discharge recommendations. Pt lives in private residence where she is assisted by daughter prior to admission.  Action/Plan: Will continue to follow.    Expected Discharge Date:                  Expected Discharge Plan:     In-House Referral:     Discharge planning Services     Post Acute Care Choice:    Choice offered to:     DME Arranged:    DME Agency:     HH Arranged:    HH Agency:     Status of Service:     Medicare Important Message Given:    Date Medicare IM Given:    Medicare IM give by:    Date Additional Medicare IM Given:    Additional Medicare Important Message give by:     If discussed at Long Length of Stay Meetings, dates discussed:    Additional Comments:  Yvone NeuCrutchfield, Rheanna Sergent M, RN 01/21/2016, 11:08 AM

## 2016-01-21 NOTE — Progress Notes (Signed)
Ref: Michaela RodriguezKADAKIA,Michaela Jaspers S, MD   Subjective:  Feeling better. Not dizzy. Blood pressure improving. Afebrile.  Objective:  Vital Signs in the last 24 hours: Temp:  [97.5 F (36.4 C)-98.4 F (36.9 C)] 98 F (36.7 C) (03/15 0500) Pulse Rate:  [66-82] 78 (03/15 0500) Cardiac Rhythm:  [-] Normal sinus rhythm (03/14 1900) Resp:  [18] 18 (03/15 0500) BP: (115-155)/(52-66) 132/56 mmHg (03/15 0500) SpO2:  [95 %-98 %] 96 % (03/15 0500) Weight:  [63 kg (138 lb 14.2 oz)-63.05 kg (139 lb)] 63.05 kg (139 lb) (03/15 0500)  Physical Exam: BP Readings from Last 1 Encounters:  01/21/16 132/56    Wt Readings from Last 1 Encounters:  01/21/16 63.05 kg (139 lb)    Weight change:   HEENT: [place]/AT, Eyes-Brown, PERL, EOMI, Conjunctiva-Pink, Sclera-Non-icteric Neck: No JVD, No bruit, Trachea midline. Lungs:  Clear, Bilateral. Cardiac:  Regular rhythm, normal S1 and S2, no S3. III/VI systolic murmur Abdomen:  Soft, non-tender. Extremities:  No edema present. No cyanosis. No clubbing. CNS: AxOx3, Cranial nerves grossly intact, moves all 4 extremities. Right handed. Skin: Warm and dry.   Intake/Output from previous day: 03/14 0701 - 03/15 0700 In: 1141.3 [P.O.:720; I.V.:421.3] Out: 700 [Urine:700]    Lab Results: BMET    Component Value Date/Time   NA 141 01/21/2016 0525   NA 141 01/20/2016 1735   NA 142 12/16/2015 0624   K 4.1 01/21/2016 0525   K 4.4 01/20/2016 1735   K 3.7 12/16/2015 0624   CL 107 01/21/2016 0525   CL 104 01/20/2016 1735   CL 104 12/16/2015 0624   CO2 24 01/21/2016 0525   CO2 26 01/20/2016 1735   CO2 27 12/16/2015 0624   GLUCOSE 171* 01/21/2016 0525   GLUCOSE 208* 01/20/2016 1735   GLUCOSE 166* 12/16/2015 0624   BUN 17 01/21/2016 0525   BUN 20 01/20/2016 1735   BUN 14 12/16/2015 0624   CREATININE 1.07* 01/21/2016 0525   CREATININE 1.17* 01/20/2016 1735   CREATININE 0.96 12/16/2015 0624   CALCIUM 11.2* 01/21/2016 0525   CALCIUM 11.7* 01/20/2016 1735   CALCIUM 11.1*  12/16/2015 0624   GFRNONAA 46* 01/21/2016 0525   GFRNONAA 41* 01/20/2016 1735   GFRNONAA 52* 12/16/2015 0624   GFRAA 53* 01/21/2016 0525   GFRAA 47* 01/20/2016 1735   GFRAA >60 12/16/2015 0624   CBC    Component Value Date/Time   WBC 10.6* 01/21/2016 0525   RBC 3.71* 01/21/2016 0525   RBC 3.38* 10/27/2009 0610   HGB 10.8* 01/21/2016 0525   HCT 32.2* 01/21/2016 0525   PLT 241 01/21/2016 0525   MCV 86.8 01/21/2016 0525   MCH 29.1 01/21/2016 0525   MCHC 33.5 01/21/2016 0525   RDW 12.9 01/21/2016 0525   LYMPHSABS 2.5 01/20/2016 1735   MONOABS 0.4 01/20/2016 1735   EOSABS 0.2 01/20/2016 1735   BASOSABS 0.0 01/20/2016 1735   HEPATIC Function Panel  Recent Labs  01/20/16 1735  PROT 6.2*   HEMOGLOBIN A1C No components found for: HGA1C,  MPG CARDIAC ENZYMES No results found for: CKTOTAL, CKMB, CKMBINDEX, TROPONINI BNP No results for input(Herrera): PROBNP in the last 8760 hours. TSH No results for input(Herrera): TSH in the last 8760 hours. CHOLESTEROL No results for input(Herrera): CHOL in the last 8760 hours.  Scheduled Meds: . cloNIDine  0.1 mg Oral BID  . [START ON 01/22/2016] glipiZIDE  5 mg Oral Q breakfast  . heparin  5,000 Units Subcutaneous 3 times per day  . insulin aspart  0-9  Units Subcutaneous TID WC  . losartan  25 mg Oral Daily  . metFORMIN  1,000 mg Oral BID WC  . metoprolol  50 mg Oral BID  . potassium chloride  10 mEq Oral Daily  . sodium chloride flush  3 mL Intravenous Q12H   Continuous Infusions: . sodium chloride 40 mL/hr at 01/20/16 1628   PRN Meds:.  Assessment/Plan: Syncope Essential hypertension DM, II with hyperglycemia Hyperlipidemia Mitral regurgitation Gait abnormality Old cerebellar infarcts Hypercalcemia  Decrease IV fluids and resume clonidine and losartan at reduced dose.    LOS: 1 day    Michaela Cobb  MD  01/21/2016, 10:09 AM

## 2016-01-21 NOTE — Progress Notes (Signed)
Pt. With blood glucose of 56. Pt. Alert and stable. No s/s of distress noted. Pt. Denies any discomfort. PO snack given to patient. Thurnell LoseA. Kadakia, MD paged. RN will continue to monitor pt. For changes in condition. Rorie Delmore, Cheryll DessertKaren Cherrell

## 2016-01-22 LAB — GLUCOSE, CAPILLARY
GLUCOSE-CAPILLARY: 118 mg/dL — AB (ref 65–99)
GLUCOSE-CAPILLARY: 144 mg/dL — AB (ref 65–99)
GLUCOSE-CAPILLARY: 151 mg/dL — AB (ref 65–99)
Glucose-Capillary: 102 mg/dL — ABNORMAL HIGH (ref 65–99)

## 2016-01-22 MED ORDER — METFORMIN HCL 500 MG PO TABS
500.0000 mg | ORAL_TABLET | Freq: Two times a day (BID) | ORAL | Status: DC
  Administered 2016-01-22: 500 mg via ORAL
  Filled 2016-01-22 (×2): qty 1

## 2016-01-22 MED ORDER — ISOSORBIDE MONONITRATE ER 30 MG PO TB24
30.0000 mg | ORAL_TABLET | Freq: Every day | ORAL | Status: DC
  Administered 2016-01-22: 30 mg via ORAL
  Filled 2016-01-22: qty 1

## 2016-01-22 MED ORDER — LOSARTAN POTASSIUM 50 MG PO TABS: 50.0000 mg | ORAL_TABLET | Freq: Every day | ORAL | Status: DC

## 2016-01-22 MED ORDER — AMLODIPINE BESYLATE 5 MG PO TABS
5.0000 mg | ORAL_TABLET | Freq: Every day | ORAL | Status: DC
  Administered 2016-01-22: 5 mg via ORAL
  Filled 2016-01-22: qty 1

## 2016-01-22 NOTE — Progress Notes (Signed)
Inpatient Diabetes Program Recommendations  AACE/ADA: New Consensus Statement on Inpatient Glycemic Control (2015)  Target Ranges:  Prepandial:   less than 140 mg/dL      Peak postprandial:   less than 180 mg/dL (1-2 hours)      Critically ill patients:  140 - 180 mg/dL   Review of Glycemic Control  Results for Michaela Herrera, [name] (MRN [id number]) as of 01/22/2016 09:09  Ref. Range 01/21/2016 11:30 01/21/2016 16:28 01/21/2016 22:55 01/22/2016 00:37 01/22/2016 05:37  Glucose-Capillary Latest Ref Range: 65-99 mg/dL 782121 (H) 956116 (H) 56 (L) 151 (H) 102 (H)    Diabetes history: Type 2 Outpatient Diabetes medications: Glipizide 5mg  bid, Novolog 0-9 units tid, Metformin 1000mg  bid, Januvia 100mg /day Current orders for Inpatient glycemic control: Novolog 0-9 units tid  Inpatient Diabetes Program Recommendations:  Agree with current diabetes medication orders.  Please consider checking an A1C.  Please consider not re-ordering glipizide at discharge- high risk of hypoglycemia.   Susette RacerJulie Gaylynn Seiple, RN, BA, MHA, CDE Diabetes Coordinator Inpatient Diabetes Program  [phone] (Team Pager) [phone] [facility] Office) 01/22/2016 9:12 AM

## 2016-01-22 NOTE — Progress Notes (Addendum)
Notified Dr Algie CofferKadakia at 1552 of pt's BP 186/67 & 183/63 at about 1518/1522 respectively and 188/60 at 1533 manually.  Blood sugar checked 144mg /dl as instructed to check per MD and was informed.  Instructed to continue to check CBG four times a day as ordered w/o and don't cover only is too high or call MD if need clarification and will order some meds for pt's BP.  Will continue to monitor.  Amanda PeaNellie Nayellie Sanseverino, Charity fundraiserN.

## 2016-01-22 NOTE — Evaluation (Signed)
Physical Therapy Evaluation Patient Details Name: Michaela SierrasMargie N Goldbach MRN: [id number] DOB: 1928-05-23 Today's Date: 01/22/2016   History of Present Illness  pt is an 82 y/o female with h/o HTN, DM, old cerebellar infarcts admitted after syncopal episode in  PCP office on 01/20/16.  Clinical Impression  The patient is very pleasant. Ambulated with min guard w/ quad cane. Gait is slightly unsteady. Recommended that she use her RW in house at DC. Recommend  HHPT for safety eval.Pt admitted with above diagnosis. Pt currently with functional limitations due to the deficits listed below (see PT Problem List). Pt will benefit from skilled PT to increase their independence and safety with mobility to allow discharge to the home. BP-supine=174/57 HR 64   Sitting=195/70    Post amb.=190/78 /68     Follow Up Recommendations Home health PT;Supervision - Intermittent    Equipment Recommendations  None recommended by PT    Recommendations for Other Services       Precautions / Restrictions Precautions Precautions: Fall      Mobility  Bed Mobility Overal bed mobility: Modified Independent                Transfers Overall transfer level: Needs assistance Equipment used: Quad cane Transfers: Sit to/from Stand Sit to Stand: Supervision            Ambulation/Gait Ambulation/Gait assistance: Min guard Ambulation Distance (Feet): 100 Feet Assistive device: Quad cane Gait Pattern/deviations: Step-through pattern;Staggering left   Gait velocity interpretation: Below normal speed for age/gender General Gait Details: mildly unsteady with turning, not overtly loss, steadies self.  Stairs            Wheelchair Mobility    Modified Rankin (Stroke Patients Only)       Balance Overall balance assessment: History of Falls;Needs assistance         Standing balance support: During functional activity Standing balance-Leahy Scale: Poor                                Pertinent Vitals/Pain Pain Assessment: No/denies pain    Home Living Family/patient expects to be discharged to:: Private residence Living Arrangements: Children Available Help at Discharge: Family;Other (Comment) Type of Home: House Home Access: Stairs to enter   Entergy CorporationEntrance Stairs-Number of Steps: several Home Layout: One level Home Equipment: Grab bars - tub/shower;Cane - quad;Walker - 4 wheels      Prior Function Level of Independence: Needs assistance   Gait / Transfers Assistance Needed: independent in home with quad cane, but unsteady  ADL's / Homemaking Assistance Needed: daughter stands by for bath        Hand Dominance        Extremity/Trunk Assessment   Upper Extremity Assessment: Generalized weakness           Lower Extremity Assessment: Generalized weakness      Cervical / Trunk Assessment: Kyphotic  Communication   Communication: No difficulties  Cognition Arousal/Alertness: Awake/alert Behavior During Therapy: WFL for tasks assessed/performed Overall Cognitive Status: Within Functional Limits for tasks assessed                      General Comments      Exercises        Assessment/Plan    PT Assessment Patient needs continued PT services  PT Diagnosis Generalized weakness   PT Problem List Decreased strength;Decreased activity tolerance;Decreased balance;Decreased mobility  PT Treatment Interventions  DME instruction;Gait training;Functional mobility training;Therapeutic activities;Therapeutic exercise;Balance training;Patient/family education   PT Goals (Current goals can be found in the Care Plan section) Acute Rehab PT Goals Patient Stated Goal: to go home today PT Goal Formulation: With patient Time For Goal Achievement: 02/05/16 Potential to Achieve Goals: Good    Frequency Min 3X/week   Barriers to discharge        Co-evaluation               End of Session Equipment Utilized During Treatment: Gait  belt Activity Tolerance: Patient tolerated treatment well Patient left: in bed;with call bell/phone within reach;with bed alarm set Nurse Communication: Mobility status         Time: 0829-0900 PT Time Calculation (min) (ACUTE ONLY): 31 min   Charges:   PT Evaluation $PT Eval Low Complexity: 1 Procedure PT Treatments $Gait Training: 8-22 mins   PT G Codes:        Rada Hay 01/22/2016, 9:11 AM Blanchard Kelch PT [phone]

## 2016-01-23 LAB — CBC WITH DIFFERENTIAL/PLATELET
BASOS ABS: 0 10*3/uL (ref 0.0–0.1)
BASOS PCT: 0 %
EOS PCT: 1 %
Eosinophils Absolute: 0.1 10*3/uL (ref 0.0–0.7)
HCT: 36 % (ref 36.0–46.0)
Hemoglobin: 11.9 g/dL — ABNORMAL LOW (ref 12.0–15.0)
Lymphocytes Relative: 19 %
Lymphs Abs: 2.4 10*3/uL (ref 0.7–4.0)
MCH: 28.8 pg (ref 26.0–34.0)
MCHC: 33.1 g/dL (ref 30.0–36.0)
MCV: 87.2 fL (ref 78.0–100.0)
MONO ABS: 0.5 10*3/uL (ref 0.1–1.0)
Monocytes Relative: 4 %
NEUTROS ABS: 10 10*3/uL — AB (ref 1.7–7.7)
Neutrophils Relative %: 76 %
PLATELETS: 291 10*3/uL (ref 150–400)
RBC: 4.13 MIL/uL (ref 3.87–5.11)
RDW: 13 % (ref 11.5–15.5)
WBC: 13.1 10*3/uL — ABNORMAL HIGH (ref 4.0–10.5)

## 2016-01-23 LAB — BASIC METABOLIC PANEL
ANION GAP: 9 (ref 5–15)
BUN: 10 mg/dL (ref 6–20)
CALCIUM: 11.9 mg/dL — AB (ref 8.9–10.3)
CO2: 27 mmol/L (ref 22–32)
Chloride: 108 mmol/L (ref 101–111)
Creatinine, Ser: 1.02 mg/dL — ABNORMAL HIGH (ref 0.44–1.00)
GFR, EST AFRICAN AMERICAN: 56 mL/min — AB (ref >60)
GFR, EST NON AFRICAN AMERICAN: 48 mL/min — AB (ref >60)
Glucose, Bld: 232 mg/dL — ABNORMAL HIGH (ref 65–99)
Potassium: 4.7 mmol/L (ref 3.5–5.1)
SODIUM: 144 mmol/L (ref 135–145)

## 2016-01-23 LAB — GLUCOSE, CAPILLARY
Glucose-Capillary: 148 mg/dL — ABNORMAL HIGH (ref 65–99)
Glucose-Capillary: 187 mg/dL — ABNORMAL HIGH (ref 65–99)

## 2016-01-23 MED ORDER — LINAGLIPTIN 5 MG PO TABS
2.5000 mg | ORAL_TABLET | Freq: Every day | ORAL | Status: DC
  Administered 2016-01-23: 2.5 mg via ORAL
  Filled 2016-01-23: qty 1

## 2016-01-23 MED ORDER — AMLODIPINE BESYLATE 5 MG PO TABS
5.0000 mg | ORAL_TABLET | Freq: Two times a day (BID) | ORAL | Status: DC
Start: 2016-01-23 — End: 2016-01-23
  Administered 2016-01-23: 5 mg via ORAL
  Filled 2016-01-23: qty 1

## 2016-01-23 MED ORDER — LINAGLIPTIN 5 MG PO TABS: 2.5000 mg | ORAL_TABLET | Freq: Two times a day (BID) | ORAL | Status: DC

## 2016-01-23 MED ORDER — METFORMIN HCL 500 MG PO TABS
500.0000 mg | ORAL_TABLET | Freq: Two times a day (BID) | ORAL | Status: DC
Start: 2016-01-23 — End: 2016-01-23
  Administered 2016-01-23: 500 mg via ORAL
  Filled 2016-01-23: qty 1

## 2016-01-23 MED ORDER — METFORMIN HCL 500 MG PO TABS: 1000.0000 mg | ORAL_TABLET | Freq: Two times a day (BID) | ORAL | Status: DC

## 2016-01-23 MED ORDER — ISOSORBIDE MONONITRATE ER 60 MG PO TB24
60.0000 mg | ORAL_TABLET | Freq: Every day | ORAL | Status: DC
  Administered 2016-01-23: 60 mg via ORAL
  Filled 2016-01-23: qty 1

## 2016-01-23 MED ORDER — CLONIDINE HCL 0.2 MG PO TABS: 0.2000 mg | ORAL_TABLET | Freq: Every day | ORAL | Status: DC

## 2016-01-23 MED ORDER — GLIPIZIDE ER 5 MG PO TB24: 5.0000 mg | ORAL_TABLET | Freq: Every day | ORAL | Status: AC

## 2016-01-23 MED ORDER — FUROSEMIDE 20 MG PO TABS: 20.0000 mg | ORAL_TABLET | ORAL | Status: DC

## 2016-01-23 MED ORDER — LOSARTAN POTASSIUM 50 MG PO TABS
100.0000 mg | ORAL_TABLET | Freq: Every day | ORAL | Status: DC
  Administered 2016-01-23: 100 mg via ORAL
  Filled 2016-01-23: qty 2

## 2016-01-23 NOTE — Progress Notes (Signed)
Discharge instructions reviewed with patient/family. All questions answered at this time. Transport home per family.  Itzel Lowrimore, RN 

## 2016-01-23 NOTE — Discharge Summary (Signed)
Physician Discharge Summary  Patient ID: Michaela Herrera MRN: [id number] DOB/AGE: [birth year] 82 y.o.  Admit date: 01/20/2016 Discharge date: 01/23/2016  Admission Diagnoses: Syncope Hypoglycemia Essential hypertension DM, II with hyperglycemia Hyperlipidemia Mitral regurgitation Gait abnormality Old cerebellar infarcts Hypercalcemia Discharge Diagnoses:  Principal Problem:   Syncope Active Problems:   Hypoglycemia   Essential hypertension  DM, II with hyperglycemia   Hyperlipidemia   Mitral regurgitation   Gait abnormality   Old cerebellar infarcts   Hypercalcemia   Anxiety   Senile dementia  Discharged Condition: fair  Hospital Course: 82 year old female with diabetes mellitus, type II, old cerebellar infarcts and hypertension had near syncopal episode without vision disturbance. The event was witnessed in office where patient was unresponsive for 2-3 minutes and able to mumble few words. She was made supine on bed for 5 minutes and felt better. Her blood pressure was low at 90/45 and improved to 110/50 with 10 minutes rest. She was not hypoglycemic. She had similar episode about 5-6 weeks ago. Her antihypertensive and diabetic medications were adjusted. She was awake but had off and on confusion. She was able to walk well. She was discharged home with home health RN for diabetic control and education. She will be followed by me in 1 week.  Consults: cardiology  Significant Diagnostic Studies: labs: CBC and BMET were unremarkable with blood sugar in 171 to 232 mg. range. Hgb A1C was pending.   CXR-unremarkable.  EKG- NSR.  Treatments: cardiac meds: metoprolol, losartan, Isosorbide, amlodipine, furosemide and potassium. Clonidine and Glucotrol doses were decreased.  Discharge Exam: Blood pressure 123/62, pulse 63, temperature 98.4 F (36.9 C), temperature source Oral, resp. rate 18, height 5\' 4"  (1.626 m), weight 63.458 kg (139 lb 14.4 oz), SpO2 98 %. HEENT: [place]/AT,  Eyes-Brown, PERL, EOMI, Conjunctiva-Pink, Sclera-Non-icteric Neck: No JVD, No bruit, Trachea midline. Lungs: Clear, Bilateral. Cardiac: Regular rhythm, normal S1 and S2, no S3. III/VI systolic murmur. Abdomen: Soft, non-tender. Extremities: No edema present. No cyanosis. No clubbing. +ve arthritic changes in hand. CNS: AxOx3, Cranial nerves grossly intact, moves all 4 extremities. Right handed. Skin: Warm and dry.  Disposition: 01-Home or Self Care     Medication List    STOP taking these medications        sitaGLIPtin 100 MG tablet  Commonly known as:  JANUVIA      TAKE these medications        amLODipine 5 MG tablet  Commonly known as:  NORVASC  Take 5 mg by mouth 2 (two) times daily.     b complex vitamins tablet  Take 1 tablet by mouth daily.     cloNIDine 0.2 MG tablet  Commonly known as:  CATAPRES  Take 1 tablet (0.2 mg total) by mouth at bedtime.     furosemide 20 MG tablet  Commonly known as:  LASIX  Take 1 tablet (20 mg total) by mouth every Monday, Wednesday, and Friday.     glipiZIDE 5 MG 24 hr tablet  Commonly known as:  GLUCOTROL XL  Take 1 tablet (5 mg total) by mouth daily with breakfast.     insulin aspart 100 UNIT/ML injection  Commonly known as:  novoLOG  Inject 0-9 Units into the skin 3 (three) times daily with meals.     isosorbide mononitrate 60 MG 24 hr tablet  Commonly known as:  IMDUR  Take 60 mg by mouth daily.     losartan 100 MG tablet  Commonly known as:  COZAAR  Take 100 mg by mouth daily.     metFORMIN 500 MG tablet  Commonly known as:  GLUCOPHAGE  Take 1,000 mg by mouth 2 (two) times daily with a meal.     metoprolol 50 MG tablet  Commonly known as:  LOPRESSOR  Take 50 mg by mouth 2 (two) times daily.     NESINA 25 MG Tabs  Generic drug:  Alogliptin Benzoate  Take 25 mg by mouth daily.     potassium chloride 10 MEQ tablet  Commonly known as:  K-DUR,KLOR-CON  Take 10 mEq by mouth daily.     VITAMIN D PO  Take 1  tablet by mouth daily.           Follow-up Information    Follow up with Ricki Rodriguez, MD On 01/27/2016.   Specialty:  Cardiology   Why:  ;30am   Contact information:   [address] [place] [address] [phone]       Follow up with [facility].   Why:  They will do your home health care at your home   Contact information:   [address] [place] [address] [phone]       Follow up with [facility] S, MD. Schedule an appointment as soon as possible for a visit in 1 week.   Specialty:  Cardiology   Contact information:   [address] [place] [address] [phone]       Signed: Ricki Rodriguez 01/23/2016, 2:07 PM

## 2016-01-23 NOTE — Progress Notes (Signed)
Pt. With elevated BP of 190/70. Pt. In stable condition. RN will continue to monitor pt. For changes in condition. Dr. Algie CofferKadakia, MD paged.

## 2016-01-23 NOTE — Progress Notes (Signed)
Late entry: Ref: Clemencia Helzer S, MD   Subjective:  Elevated blood pressure. Improving blood sugars.  Objective:  Vital Signs in the last 24 hours: Temp:  [98.2 F (36.8 C)-98.9 F (37.2 C)] 98.9 F (37.2 C) (03/17 0540) Pulse Rate:  [60-72] 72 (03/17 0540) Cardiac Rhythm:  [-] Sinus bradycardia (03/16 1900) Resp:  [18-20] 20 (03/17 0540) BP: (177-190)/(63-89) 190/70 mmHg (03/17 0540) SpO2:  [95 %-100 %] 99 % (03/17 0540) Weight:  [63.458 kg (139 lb 14.4 oz)] 63.458 kg (139 lb 14.4 oz) (03/17 0540)  Physical Exam: BP Readings from Last 1 Encounters:  01/23/16 190/70    Wt Readings from Last 1 Encounters:  01/23/16 63.458 kg (139 lb 14.4 oz)    Weight change: -0.045 kg (-1.6 oz)  HEENT: [place]/AT, Eyes-Brown, PERL, EOMI, Conjunctiva-Pink, Sclera-Non-icteric Neck: No JVD, No bruit, Trachea midline. Lungs:  Clear, Bilateral. Cardiac:  Regular rhythm, normal S1 and S2, no S3.  III/VI systolic murmur. Abdomen:  Soft, non-tender. Extremities:  No edema present. No cyanosis. No clubbing. CNS: AxOx3, Cranial nerves grossly intact, moves all 4 extremities. Right handed. Skin: Warm and dry.   Intake/Output from previous day: 03/16 0701 - 03/17 0700 In: 1942 [P.O.:1042; I.V.:900] Out: 550 [Urine:550]    Lab Results: BMET    Component Value Date/Time   NA 141 01/21/2016 0525   NA 141 01/20/2016 1735   NA 142 12/16/2015 0624   K 4.1 01/21/2016 0525   K 4.4 01/20/2016 1735   K 3.7 12/16/2015 0624   CL 107 01/21/2016 0525   CL 104 01/20/2016 1735   CL 104 12/16/2015 0624   CO2 24 01/21/2016 0525   CO2 26 01/20/2016 1735   CO2 27 12/16/2015 0624   GLUCOSE 171* 01/21/2016 0525   GLUCOSE 208* 01/20/2016 1735   GLUCOSE 166* 12/16/2015 0624   BUN 17 01/21/2016 0525   BUN 20 01/20/2016 1735   BUN 14 12/16/2015 0624   CREATININE 1.07* 01/21/2016 0525   CREATININE 1.17* 01/20/2016 1735   CREATININE 0.96 12/16/2015 0624   CALCIUM 11.2* 01/21/2016 0525   CALCIUM 11.7*  01/20/2016 1735   CALCIUM 11.1* 12/16/2015 0624   GFRNONAA 46* 01/21/2016 0525   GFRNONAA 41* 01/20/2016 1735   GFRNONAA 52* 12/16/2015 0624   GFRAA 53* 01/21/2016 0525   GFRAA 47* 01/20/2016 1735   GFRAA >60 12/16/2015 0624   CBC    Component Value Date/Time   WBC 10.6* 01/21/2016 0525   RBC 3.71* 01/21/2016 0525   RBC 3.38* 10/27/2009 0610   HGB 10.8* 01/21/2016 0525   HCT 32.2* 01/21/2016 0525   PLT 241 01/21/2016 0525   MCV 86.8 01/21/2016 0525   MCH 29.1 01/21/2016 0525   MCHC 33.5 01/21/2016 0525   RDW 12.9 01/21/2016 0525   LYMPHSABS 2.5 01/20/2016 1735   MONOABS 0.4 01/20/2016 1735   EOSABS 0.2 01/20/2016 1735   BASOSABS 0.0 01/20/2016 1735   HEPATIC Function Panel  Recent Labs  01/20/16 1735  PROT 6.2*   HEMOGLOBIN A1C No components found for: HGA1C,  MPG CARDIAC ENZYMES No results found for: CKTOTAL, CKMB, CKMBINDEX, TROPONINI BNP No results for input(s): PROBNP in the last 8760 hours. TSH No results for input(s): TSH in the last 8760 hours. CHOLESTEROL No results for input(s): CHOL in the last 8760 hours.  Scheduled Meds: . amLODipine  5 mg Oral BID  . cloNIDine  0.1 mg Oral BID  . heparin  5,000 Units Subcutaneous 3 times per day  . insulin aspart  0-9  Units Subcutaneous TID WC  . isosorbide mononitrate  60 mg Oral Daily  . linagliptin  2.5 mg Oral Daily  . losartan  100 mg Oral Daily  . metFORMIN  500 mg Oral BID WC  . metoprolol  50 mg Oral BID  . potassium chloride  10 mEq Oral Daily  . sodium chloride flush  3 mL Intravenous Q12H   Continuous Infusions:  PRN Meds:.  Assessment/Plan: Syncope Hypoglycemia Essential hypertension DM, II with hyperglycemia Hyperlipidemia Mitral regurgitation Gait abnormality Old cerebellar infarcts Hypercalcemia  Increase antihypertensive medications. Increase activity.    LOS: 2 days    Orpah CobbAjay Dorann Davidson  MD  01/23/2016, 8:44 AM

## 2016-01-23 NOTE — Care Management Note (Signed)
Case Management Note  Patient Details  Name: Michaela Herrera MRN: [id number] Date of Birth: [birth year]  Subjective/Objective:     Admitted with Syncope              Action/Plan: Patient lives at home with her daughter, Has private insurance with [employer] with prescription drug coverage; pharmacy of choice is [facility], no problem getting her medication. She has a walker and cane at home. HHC choices offered, daughter requested [facility], [facility]. Attending MD at discharge please enter the face to face document in Epic per Medicare requirements [facility].   Expected Discharge Date: possibly 01/23/2016                 Expected Discharge Plan:  Home w Home Health Services  Discharge planning Services  CM Consult  Choice offered to:  Adult Children  HH Arranged:  RN, PT HH Agency:  [facility]  Status of Service:  In process, will continue to follow  Reola MosherChandler, Jazarah Capili L, RN,MHA,BSN [phone] 01/23/2016, 10:38 AM

## 2016-01-23 NOTE — Care Management Important Message (Signed)
Important Message  Patient Details  Name: Michaela Herrera MRN: [id number] Date of Birth: 19-Sep-1928   Medicare Important Message Given:  Yes    [name] 01/23/2016, 2:31 PM

## 2016-01-24 LAB — HEMOGLOBIN A1C
Hgb A1c MFr Bld: 7.5 % — ABNORMAL HIGH (ref 4.8–5.6)
Mean Plasma Glucose: 169 mg/dL

## 2016-06-30 ENCOUNTER — Other Ambulatory Visit: Payer: Self-pay | Admitting: Cardiovascular Disease

## 2016-06-30 ENCOUNTER — Ambulatory Visit
Admission: RE | Admit: 2016-06-30 | Discharge: 2016-06-30 | Disposition: A | Discharge status: Home / Self Care | Payer: Medicare HMO | Source: Ambulatory Visit | Attending: Cardiovascular Disease | Admitting: Cardiovascular Disease

## 2016-06-30 DIAGNOSIS — R059 Cough, unspecified: Secondary | ICD-10-CM

## 2016-06-30 DIAGNOSIS — R05 Cough: Secondary | ICD-10-CM

## 2017-03-26 IMAGING — CR DG CHEST 2V
2 series · 2 of 2 positions shown · non-contrast
Comparison: 01/20/2016

CLINICAL DATA: Productive cough, congestion and fever.

EXAM:
CHEST  2 VIEW

[w chest pa]
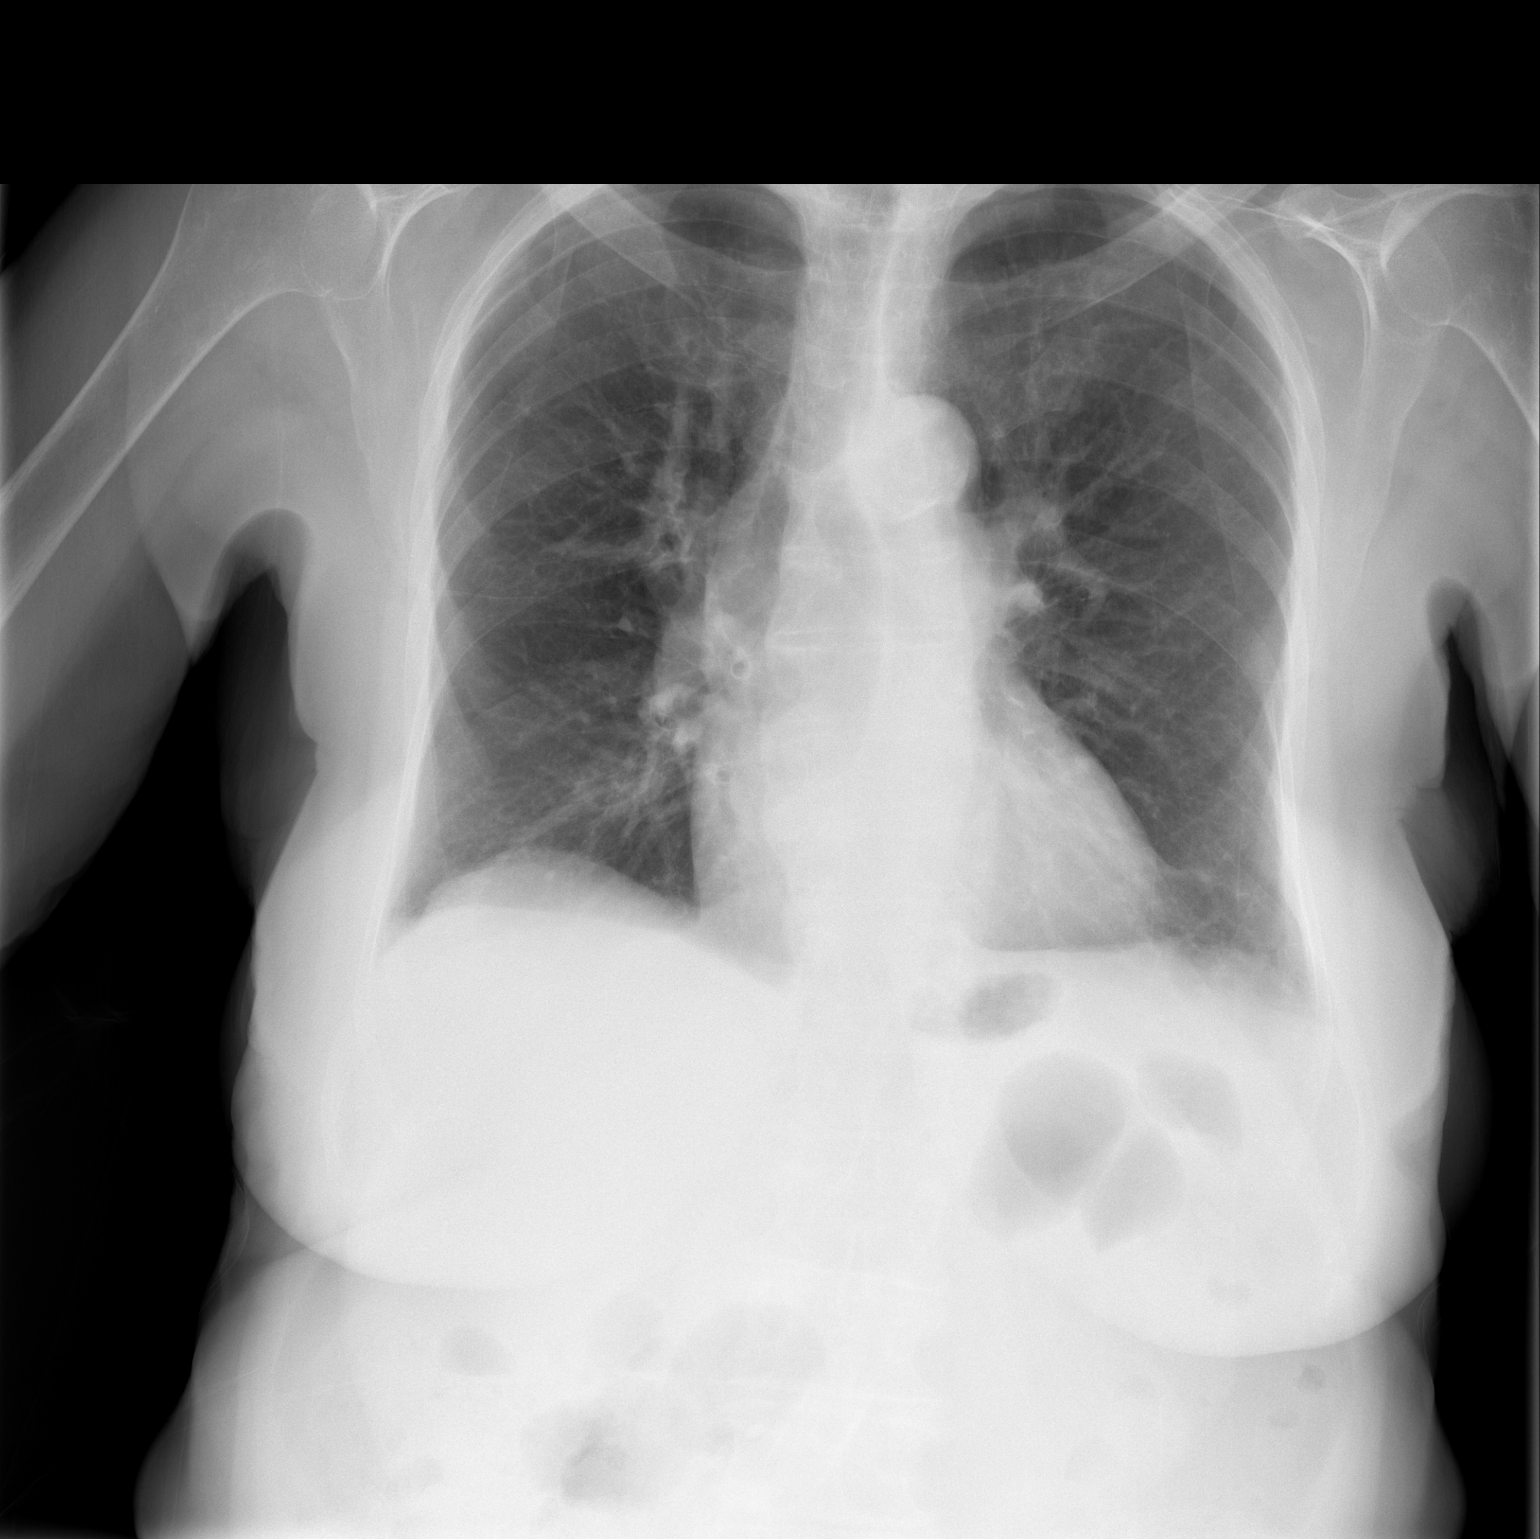

[w chest lat]
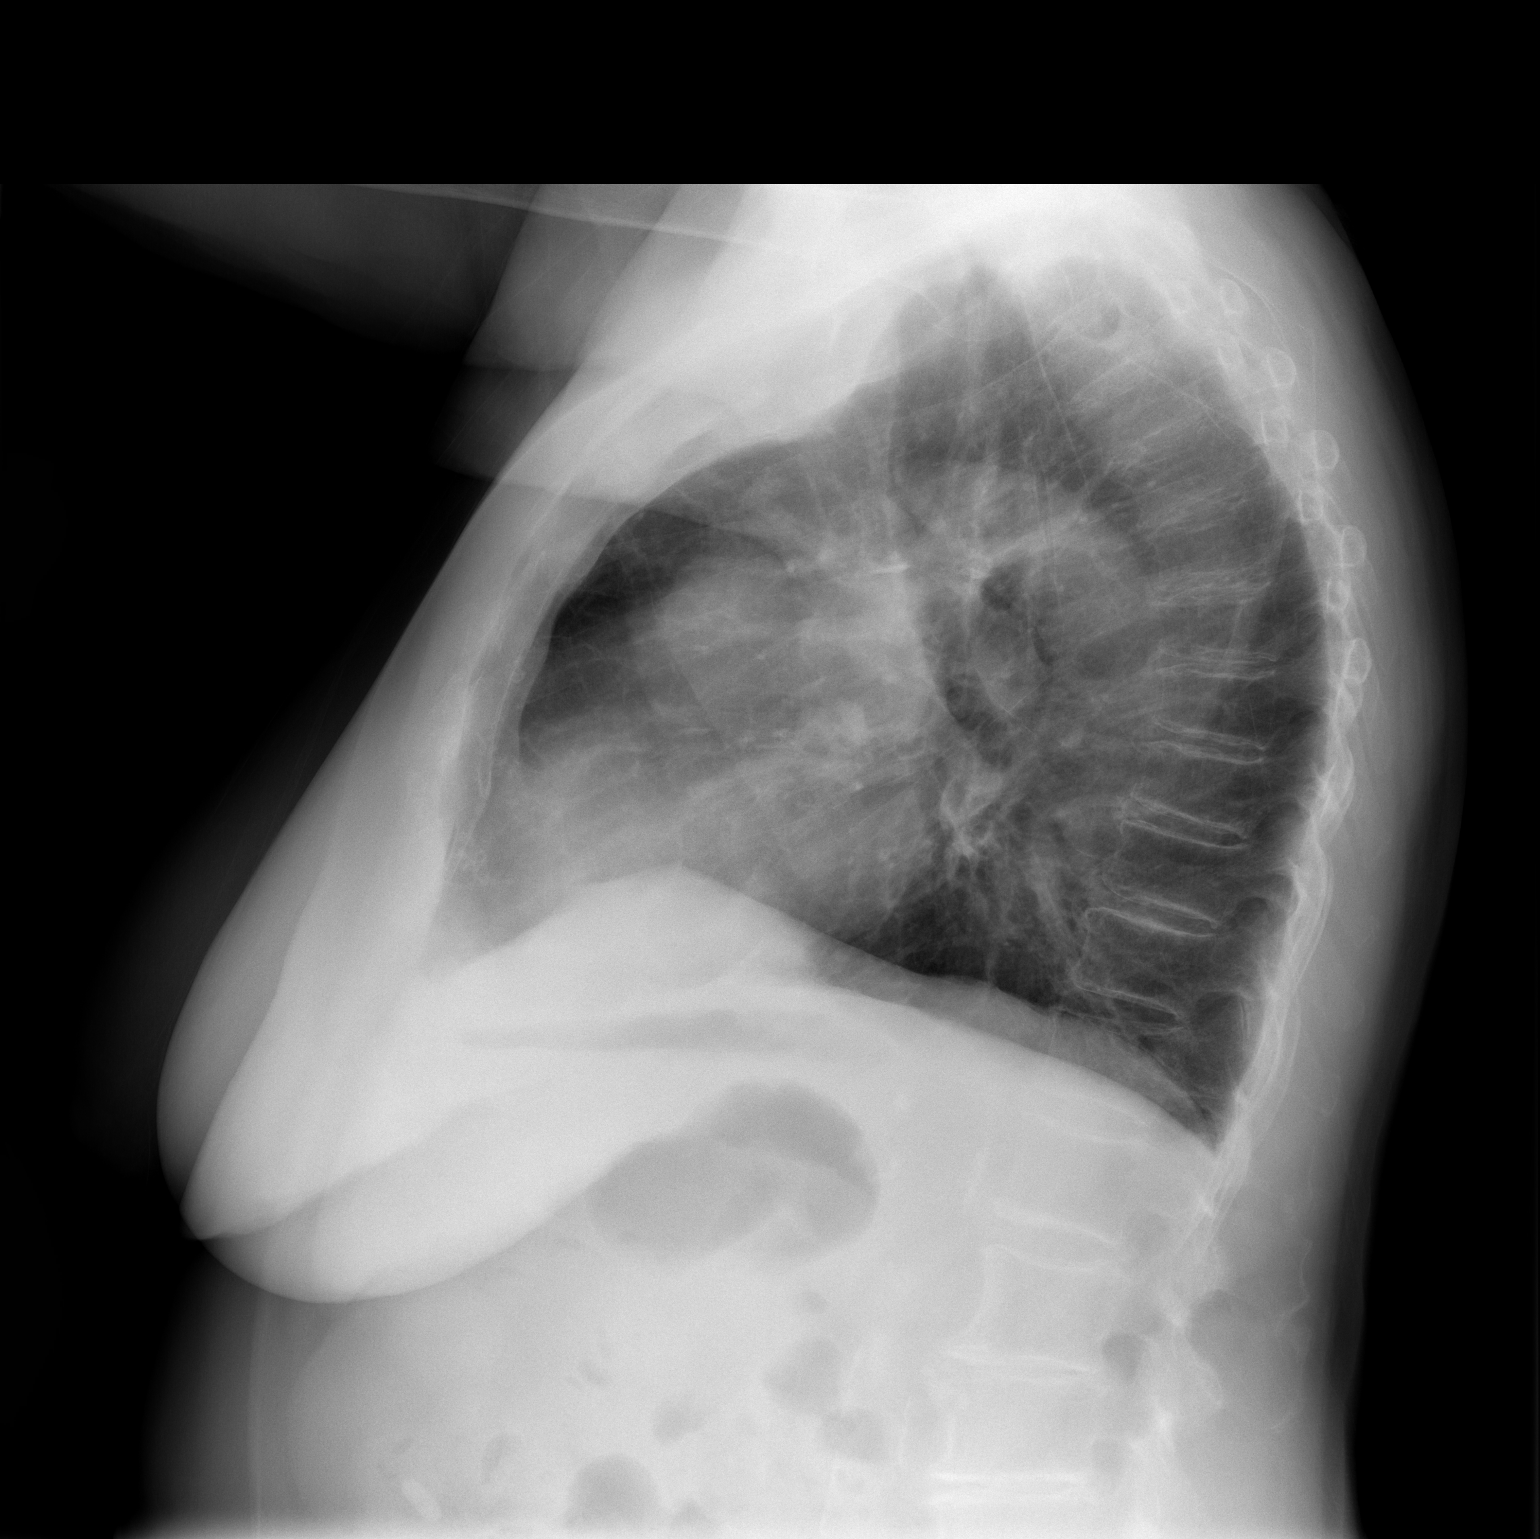

[2 of 2 positions shown; findings below may reference images not displayed]

FINDINGS: The heart size is within normal limits. Stable ectasia of the
thoracic aorta. Stable mild scarring at the left lung base. There is
no evidence of pulmonary edema, consolidation, pneumothorax, nodule
or pleural fluid. The visualized skeletal structures are
unremarkable.
IMPRESSION: No active cardiopulmonary disease.

## 2020-05-15 NOTE — Progress Notes (Addendum)
[facility] - Clinic Note  05/16/2020     CHIEF COMPLAINT Patient presents for Retina Evaluation   HISTORY OF PRESENT ILLNESS: Michaela Herrera is a 86 y.o. female who presents to the clinic today for:   HPI    Retina Evaluation    In both eyes.  Duration of 6 months.  Response to treatment was no improvement.  I, the attending physician,  performed the HPI with the patient and updated documentation appropriately.          Comments    Retina Evaluation per Dr. Krista Blue.  Patient started to notice a change in her vision around the first of the year.  It started out her left eye then moved to the right states patient.  Eyes itch BLL. DM2 x30+ years  Unsure A1C and she does not check her BS.   Daughter asked to please send chart note to Dr. Algie Coffer.       Last edited by Rennis Chris, MD on 05/16/2020 12:35 PM. (History)    pt is with her daughter, pt is here on the referral of Dr. Krista Blue for concern of DME, pt saw him 2 weeks ago, daughter states she went for a follow up appt to "see if anything had changed"  Referring physician: Demarco, [place], OD [address] [place],  [place] [address]  HISTORICAL INFORMATION:   Selected notes from the MEDICAL RECORD NUMBER Referred by Dr. [place] DeMarco for concern of DME   CURRENT MEDICATIONS: No current outpatient medications on file. (Ophthalmic Drugs)   No current facility-administered medications for this visit. (Ophthalmic Drugs)   Current Outpatient Medications (Other)  Medication Sig  . Alogliptin Benzoate (NESINA) 25 MG TABS Take 25 mg by mouth daily.  Marland Kitchen amLODipine (NORVASC) 5 MG tablet Take 5 mg by mouth 2 (two) times daily.  Marland Kitchen b complex vitamins tablet Take 1 tablet by mouth daily.  . Cholecalciferol (VITAMIN D PO) Take 1 tablet by mouth daily.  . cloNIDine (CATAPRES) 0.2 MG tablet Take 1 tablet (0.2 mg total) by mouth at bedtime.  . furosemide (LASIX) 20 MG tablet Take 1 tablet (20 mg total) by  mouth every Monday, Wednesday, and Friday.  Marland Kitchen glipiZIDE (GLUCOTROL XL) 5 MG 24 hr tablet Take 1 tablet (5 mg total) by mouth daily with breakfast.  . insulin aspart (NOVOLOG) 100 UNIT/ML injection Inject 0-9 Units into the skin 3 (three) times daily with meals.  . isosorbide mononitrate (IMDUR) 60 MG 24 hr tablet Take 60 mg by mouth daily.  Marland Kitchen losartan (COZAAR) 100 MG tablet Take 100 mg by mouth daily.  . metFORMIN (GLUCOPHAGE) 500 MG tablet Take 1,000 mg by mouth 2 (two) times daily with a meal.  . metoprolol (LOPRESSOR) 50 MG tablet Take 50 mg by mouth 2 (two) times daily.  . potassium chloride (K-DUR,KLOR-CON) 10 MEQ tablet Take 10 mEq by mouth daily.   No current facility-administered medications for this visit. (Other)      REVIEW OF SYSTEMS: ROS    Positive for: Genitourinary, Endocrine, Cardiovascular, Eyes   Negative for: Constitutional, Gastrointestinal, Neurological, Skin, Musculoskeletal, HENT, Respiratory, Psychiatric, Allergic/Imm, Heme/Lymph   Last edited by Joni Reining, COA on 05/16/2020  8:38 AM. (History)       ALLERGIES No Known Allergies  PAST MEDICAL HISTORY Past Medical History:  Diagnosis Date  . Diabetes mellitus without complication (HCC)   . GERD (gastroesophageal reflux disease)   . Hypertension    Past Surgical History:  Procedure  Laterality Date  . APPENDECTOMY    . CATARACT EXTRACTION    . COLONOSCOPY  2005    FAMILY HISTORY History reviewed. No pertinent family history.  SOCIAL HISTORY Social History   Tobacco Use  . Smoking status: Never Smoker  . Smokeless tobacco: Never Used  Substance Use Topics  . Alcohol use: No  . Drug use: No         OPHTHALMIC EXAM:  Base Eye Exam    Visual Acuity (Snellen - Linear)      Right Left   Dist [place] 20/50 +1 20/20   Dist ph [place] 20/40 -2        Tonometry (Tonopen, 8:47 AM)      Right Left   Pressure 14 13       Pupils      Dark Light Shape React APD   Right 3 2 Round Brisk None    Left 3 2 Round Brisk None       Visual Fields (Counting fingers)      Left Right    Full Full       Extraocular Movement      Right Left    Full Full       Neuro/Psych    Oriented x3: Yes   Mood/Affect: Normal       Dilation    Both eyes: 1.0% Mydriacyl, 2.5% Phenylephrine @ 8:48 AM        Slit Lamp and Fundus Exam    Slit Lamp Exam      Right Left   Lids/Lashes Dermato, mild ptosis Dermato, mild MD   Conjunctiva/Sclera White & quiet, mild inf conj. chalasis Mild melanosis, mild inf conj. chalasis   Cornea Mild arcus, 3+ inf PEE Mild arcuss, 2+ inf PEE   Anterior Chamber Deep & clear Deep and clear   Iris Round & dilated.  No NV. Round and dilated.  No NV.   Lens 3 pc IOL w/open PC; mild superior displacement 3 pc PCIOL in good position.  Open PC.   Vitreous Synerisis Synerisis       Fundus Exam      Right Left   Disc Mild pallor, sharp rim, compact, mild PPA Mild pallor, sharp rim, compact, mild PPA   C/D Ratio 0.2 0.2   Macula Flat, blunted foveal reflex, rpe mottling and clumping, mild cystic changes Blunted foveal reflex, cluster of MA and IRH/SRH temporal to fovea.  Scattered MA elsewhere.  +Edema temporal macula   Vessels Attenuated, mild tortuosity Attenuated, mild tortuosity   Periphery Attached, mild reticular degeneration, scattered DBH Mild reticular degeneration, scattered DBH        Refraction    Manifest Refraction      Sphere Cylinder Axis Dist [place]   Right -0.75 +0.50 015 20/40-2   Left [place]   20/20          IMAGING AND PROCEDURES  Imaging and Procedures for 05/16/2020  OCT, Retina - OU - Both Eyes       Right Eye Quality was good. Central Foveal Thickness: 311. Progression has no prior data. Findings include normal foveal contour, no IRF, no SRF, intraretinal hyper-reflective material (Mild cystic changes superior macula).   Left Eye Central Foveal Thickness: 308. Progression has no prior data. Findings include abnormal foveal contour,  intraretinal fluid, no SRF, intraretinal hyper-reflective material (+DME).   Notes *Images captured and stored on drive  Diagnosis / Impression:  DME OU (OS>OD) OD: mild cystic changes superior macula, non-central  OS: focal IRF/DME temporal macula  Clinical management:  See below  Abbreviations: NFP - Normal foveal profile. CME - cystoid macular edema. PED - pigment epithelial detachment. IRF - intraretinal fluid. SRF - subretinal fluid. EZ - ellipsoid zone. ERM - epiretinal membrane. ORA - outer retinal atrophy. ORT - outer retinal tubulation. SRHM - subretinal hyper-reflective material. IRHM - intraretinal hyper-reflective material        Fluorescein Angiography [place] (Transit OD)       Right Eye   Progression has no prior data. Early phase findings include microaneurysm, vascular perfusion defect (First image at 47 seconds.  No transit images.). Mid/Late phase findings include microaneurysm, leakage (No NV).   Left Eye   Progression has no prior data. Early phase findings include microaneurysm. Mid/Late phase findings include microaneurysm, leakage (No NV).   Notes **Images stored on drive**  Impression: Mod NPDR OU (OS>OD) Late Leaking MA OU No NV OU                  ASSESSMENT/PLAN:    ICD-10-CM   1. Moderate nonproliferative diabetic retinopathy of both eyes with macular edema associated with type 2 diabetes mellitus (HCC)  A83.4196   2. Retinal edema  H35.81 OCT, Retina - OU - Both Eyes  3. Essential hypertension  I10   4. Hypertensive retinopathy of both eyes  H35.033 Fluorescein Angiography [place] (Transit OD)  5. Pseudophakia of both eyes  Z96.1   6. Dry eyes  H04.123     1,2. Mod non-proliferative diabetic retinopathy OU - The incidence, risk factors for progression, natural history and treatment options for diabetic retinopathy were discussed with patient.   - The need for close monitoring of blood glucose, blood pressure, and serum  lipids, avoiding cigarette or any type of tobacco, and the need for long term follow up was also discussed with patient. - exam shows mild cystic changes OD; +DME temporal macula OS - FA 7.9.21 shows mod NPDR OU (OS>OD); late leaking MA OU; No NV OU - OCT shows diabetic macular edema OU (OS > OD) - The natural history, pathology, and characteristics of diabetic macular edema discussed with patient.  A generalized discussion of the major clinical trials concerning treatment of diabetic macular edema (ETDRS, DCT, SCORE, RISE / RIDE, and ongoing DRCR net studies) was completed.  This discussion included mention of the various approaches to treating diabetic macular edema (observation, laser photocoagulation, anti-VEGF injections with lucentis / Avastin / Eylea, steroid injections with Kenalog / Ozurdex, and intraocular surgery with vitrectomy).  The goal hemoglobin A1C of 6-7 was discussed, as well as importance of smoking cessation and hypertension control.  Need for ongoing treatment and monitoring were specifically discussed with reference to chronic nature of diabetic macular edema. - recommend monitoring for now as BCVA remains 20/20 OS where DME is worse - suspect decreased vision OD more related to DES rather than diabetic retinopathy - f/u in 6-8 wks, sooner prn -- DFE/OCT  3,4. Hypertensive retinopathy OU - discussed importance of tight BP control - monitor  5. Pseudophakia OU  - s/p CE/IOL OU  - IOLs in good position, doing well - monitor  6. Dry eyes OU (OD>OS)  - with minimal DME OD, suspect majority of decreased vision OD is due to dry eyes - recommend artificial tears QID OU and lubricating ointment as needed     Ophthalmic Meds Ordered this visit:  No orders of the defined types were placed in this encounter.  Return for 6-8 wk f/u for mod NPDR OU w/DFE&OCT.  There are no Patient Instructions on file for this visit.   Explained the diagnoses, plan, and follow up  with the patient and they expressed understanding.  Patient expressed understanding of the importance of proper follow up care.   This document serves as a record of services personally performed by Karie ChimeraBrian G. Makiyla Linch, MD, PhD. It was created on their behalf by Glee ArvinAmanda J. Manson PasseyBrown, COT, an ophthalmic technician. The creation of this record is the provider's dictation and/or activities during the visit.    Electronically signed by: Glee ArvinAmanda J. Manson PasseyBrown, [place] 07.08.2021 12:37 PM   This document serves as a record of services personally performed by Karie ChimeraBrian G. Raylyn Carton, MD, PhD. It was created on their behalf by Cristopher EstimableAndrew Baxley, COT an ophthalmic technician. The creation of this record is the provider's dictation and/or activities during the visit.    Electronically signed by: Cristopher Estimablendrew Baxley, COT 05/16/20 @ 12:37 PM  Karie ChimeraBrian G. Darrol Brandenburg, M.D., Ph.D. Diseases & Surgery of the Retina and Vitreous [facility]  I have reviewed the above documentation for accuracy and completeness, and I agree with the above. Karie ChimeraBrian G. Shomari Matusik, M.D., Ph.D. 05/16/20 12:37 PM   Abbreviations: M myopia (nearsighted); A astigmatism; H hyperopia (farsighted); P presbyopia; Mrx spectacle prescription;  CTL contact lenses; OD right eye; OS left eye; OU both eyes  XT exotropia; ET esotropia; PEK punctate epithelial keratitis; PEE punctate epithelial erosions; DES dry eye syndrome; MGD meibomian gland dysfunction; ATs artificial tears; PFAT's preservative free artificial tears; NSC nuclear sclerotic cataract; PSC posterior subcapsular cataract; ERM epi-retinal membrane; PVD posterior vitreous detachment; RD retinal detachment; DM diabetes mellitus; DR diabetic retinopathy; NPDR non-proliferative diabetic retinopathy; PDR proliferative diabetic retinopathy; CSME clinically significant macular edema; DME diabetic macular edema; dbh dot blot hemorrhages; CWS cotton wool spot; POAG primary open angle glaucoma; C/D cup-to-disc ratio; HVF  humphrey visual field; GVF goldmann visual field; OCT optical coherence tomography; IOP intraocular pressure; BRVO Branch retinal vein occlusion; CRVO central retinal vein occlusion; CRAO central retinal artery occlusion; BRAO branch retinal artery occlusion; RT retinal tear; SB scleral buckle; PPV pars plana vitrectomy; VH Vitreous hemorrhage; PRP panretinal laser photocoagulation; IVK intravitreal kenalog; VMT vitreomacular traction; MH Macular hole;  NVD neovascularization of the disc; NVE neovascularization elsewhere; AREDS age related eye disease study; ARMD age related macular degeneration; POAG primary open angle glaucoma; EBMD epithelial/anterior basement membrane dystrophy; ACIOL anterior chamber intraocular lens; IOL intraocular lens; PCIOL posterior chamber intraocular lens; Phaco/IOL phacoemulsification with intraocular lens placement; PRK photorefractive keratectomy; LASIK laser assisted in situ keratomileusis; HTN hypertension; DM diabetes mellitus; COPD chronic obstructive pulmonary disease

## 2020-05-16 ENCOUNTER — Other Ambulatory Visit: Payer: Self-pay

## 2020-05-16 ENCOUNTER — Ambulatory Visit (INDEPENDENT_AMBULATORY_CARE_PROVIDER_SITE_OTHER): Payer: Medicare Other | Admitting: Ophthalmology

## 2020-05-16 ENCOUNTER — Encounter (INDEPENDENT_AMBULATORY_CARE_PROVIDER_SITE_OTHER): Payer: Self-pay | Admitting: Ophthalmology

## 2020-05-16 DIAGNOSIS — Z961 Presence of intraocular lens: Secondary | ICD-10-CM

## 2020-05-16 DIAGNOSIS — E113313 Type 2 diabetes mellitus with moderate nonproliferative diabetic retinopathy with macular edema, bilateral: Secondary | ICD-10-CM | POA: Diagnosis not present

## 2020-05-16 DIAGNOSIS — H35033 Hypertensive retinopathy, bilateral: Secondary | ICD-10-CM | POA: Diagnosis not present

## 2020-05-16 DIAGNOSIS — H3581 Retinal edema: Secondary | ICD-10-CM

## 2020-05-16 DIAGNOSIS — I10 Essential (primary) hypertension: Secondary | ICD-10-CM | POA: Diagnosis not present

## 2020-05-16 DIAGNOSIS — H04123 Dry eye syndrome of bilateral lacrimal glands: Secondary | ICD-10-CM

## 2020-06-27 ENCOUNTER — Encounter (INDEPENDENT_AMBULATORY_CARE_PROVIDER_SITE_OTHER): Payer: Medicare Other | Admitting: Ophthalmology

## 2020-07-07 NOTE — Progress Notes (Signed)
[facility] - Clinic Note  07/09/2020     CHIEF COMPLAINT Patient presents for Retina Follow Up   HISTORY OF PRESENT ILLNESS: Michaela Herrera is a 86 y.o. female who presents to the clinic today for:   HPI    Retina Follow Up    Patient presents with  Diabetic Retinopathy.  In both eyes.  This started 7.5 weeks ago.  Severity is moderate.  I, the attending physician,  performed the HPI with the patient and updated documentation appropriately.          Comments    Patient here for 7.5 weeks retina follow up for NPDR OU. Patient states vision doing fine. No eye pain.       Last edited by Rennis Chris, MD on 07/09/2020  1:19 PM. (History)      Referring physician: Demarco, [place], OD [address] [place],  [place] [address]  HISTORICAL INFORMATION:   Selected notes from the MEDICAL RECORD NUMBER Referred by Dr. [place] DeMarco for concern of DME   CURRENT MEDICATIONS: No current outpatient medications on file. (Ophthalmic Drugs)   No current facility-administered medications for this visit. (Ophthalmic Drugs)   Current Outpatient Medications (Other)  Medication Sig  . Alogliptin Benzoate (NESINA) 25 MG TABS Take 25 mg by mouth daily.  Marland Kitchen amLODipine (NORVASC) 5 MG tablet Take 5 mg by mouth 2 (two) times daily.  Marland Kitchen b complex vitamins tablet Take 1 tablet by mouth daily.  . Cholecalciferol (VITAMIN D PO) Take 1 tablet by mouth daily.  . cloNIDine (CATAPRES) 0.2 MG tablet Take 1 tablet (0.2 mg total) by mouth at bedtime.  . furosemide (LASIX) 20 MG tablet Take 1 tablet (20 mg total) by mouth every Monday, Wednesday, and Friday.  Marland Kitchen glipiZIDE (GLUCOTROL XL) 5 MG 24 hr tablet Take 1 tablet (5 mg total) by mouth daily with breakfast.  . insulin aspart (NOVOLOG) 100 UNIT/ML injection Inject 0-9 Units into the skin 3 (three) times daily with meals.  . isosorbide mononitrate (IMDUR) 60 MG 24 hr tablet Take 60 mg by mouth daily.  Marland Kitchen losartan (COZAAR) 100 MG  tablet Take 100 mg by mouth daily.  . metFORMIN (GLUCOPHAGE) 500 MG tablet Take 1,000 mg by mouth 2 (two) times daily with a meal.  . metoprolol (LOPRESSOR) 50 MG tablet Take 50 mg by mouth 2 (two) times daily.  . potassium chloride (K-DUR,KLOR-CON) 10 MEQ tablet Take 10 mEq by mouth daily.   No current facility-administered medications for this visit. (Other)      REVIEW OF SYSTEMS: ROS    Positive for: Genitourinary, Endocrine, Cardiovascular, Eyes   Negative for: Constitutional, Gastrointestinal, Neurological, Skin, Musculoskeletal, HENT, Respiratory, Psychiatric, Allergic/Imm, Heme/Lymph   Last edited by Laddie Aquas, COA on 07/09/2020 10:03 AM. (History)       ALLERGIES No Known Allergies  PAST MEDICAL HISTORY Past Medical History:  Diagnosis Date  . Diabetes mellitus without complication (HCC)   . GERD (gastroesophageal reflux disease)   . Hypertension    Past Surgical History:  Procedure Laterality Date  . APPENDECTOMY    . CATARACT EXTRACTION    . COLONOSCOPY  2005    FAMILY HISTORY History reviewed. No pertinent family history.  SOCIAL HISTORY Social History   Tobacco Use  . Smoking status: Never Smoker  . Smokeless tobacco: Never Used  Substance Use Topics  . Alcohol use: No  . Drug use: No         OPHTHALMIC EXAM:  Base Eye Exam    Visual Acuity (Snellen - Linear)      Right Left   Dist [place] 20/60 20/25 -1   Dist ph [place] 20/30 -1 NI       Tonometry (Tonopen, 10:00 AM)      Right Left   Pressure 13 16       Pupils      Dark Light Shape React APD   Right 3 2 Round Brisk None   Left 3 2 Round Brisk None       Visual Fields (Counting fingers)      Left Right    Full Full       Extraocular Movement      Right Left    Full Full       Neuro/Psych    Oriented x3: Yes   Mood/Affect: Normal       Dilation    Both eyes: 1.0% Mydriacyl, 2.5% Phenylephrine @ 10:00 AM        Slit Lamp and Fundus Exam    Slit Lamp Exam       Right Left   Lids/Lashes Dermato, mild ptosis Dermato, mild MD   Conjunctiva/Sclera White & quiet, mild inf conj. chalasis Mild melanosis, mild inf conj. chalasis   Cornea Mild arcus, 3+ inf PEE Mild arcuss, 2+ inf PEE   Anterior Chamber Deep & clear Deep and clear   Iris Round & dilated.  No NV. Round and dilated.  No NV.   Lens 3 pc IOL w/open PC; mild superior displacement 3 pc PCIOL in good position.  Open PC.   Vitreous Synerisis Synerisis       Fundus Exam      Right Left   Disc Mild pallor, sharp rim, compact, mild PPA Mild pallor, sharp rim, compact, mild PPA   C/D Ratio 0.2 0.2   Macula Flat, blunted foveal reflex, rpe mottling and clumping, mild cystic changes Blunted foveal reflex, cluster of MA and IRH/SRH temporal to fovea.  Scattered MA elsewhere.  +Edema temporal macula   Vessels Attenuated, mild tortuosity Attenuated, mild tortuosity   Periphery Attached, mild reticular degeneration, scattered DBH Mild reticular degeneration, scattered DBH          IMAGING AND PROCEDURES  Imaging and Procedures for 07/09/2020  OCT, Retina - OU - Both Eyes       Right Eye Quality was good. Central Foveal Thickness: 282. Progression has been stable. Findings include normal foveal contour, no IRF, no SRF, intraretinal hyper-reflective material (Mild cystic changes and prominent IHRM superior macula--stable from prior).   Left Eye Central Foveal Thickness: 304. Progression has worsened. Findings include abnormal foveal contour, intraretinal fluid, no SRF, intraretinal hyper-reflective material (Mild interval increase in IRF/DME temporal and central macula).   Notes *Images captured and stored on drive  Diagnosis / Impression:  DME OU (OS>OD) OD: Mild cystic changes and prominent IHRM superior macula--stable from prior OS: Mild interval increase in IRF/DME temporal and central macula  Clinical management:  See below  Abbreviations: NFP - Normal foveal profile. CME - cystoid  macular edema. PED - pigment epithelial detachment. IRF - intraretinal fluid. SRF - subretinal fluid. EZ - ellipsoid zone. ERM - epiretinal membrane. ORA - outer retinal atrophy. ORT - outer retinal tubulation. SRHM - subretinal hyper-reflective material. IRHM - intraretinal hyper-reflective material                 ASSESSMENT/PLAN:    ICD-10-CM   1. Moderate nonproliferative diabetic retinopathy of  both eyes with macular edema associated with type 2 diabetes mellitus (HCC)  Z61.0960E11.3313   2. Retinal edema  H35.81 OCT, Retina - OU - Both Eyes  3. Essential hypertension  I10   4. Hypertensive retinopathy of both eyes  H35.033   5. Pseudophakia of both eyes  Z96.1   6. Dry eyes  H04.123     1,2. Mod non-proliferative diabetic retinopathy OU - exam shows mild cystic changes OD; +DME temporal macula OS - FA (07.09.21) shows mod NPDR OU (OS>OD); late leaking MA OU; No NV OU - OCT  OD: Mild cystic changes and prominent IHRM superior macula--stable from prior             OS: Mild interval increase in IRF/DME temporal and central macula - recommend IVA OS #1 today, 9.1.21 as BCVA down to 20/25 from 20/20 OS where DME is worse - risks and benefits of tx discussed w/pt - Pt desires to defer tx today, go home and think about it, and come back to re-evaluate in about 1 wk. - suspect blurred vision OD more related to DES rather than diabetic retinopathy - f/u in 1 wk, sooner prn -- DFE/OCT  3,4. Hypertensive retinopathy OU - discussed importance of tight BP control - monitor  5. Pseudophakia OU  - s/p CE/IOL OU  - IOLs in good position, doing well - monitor  6. Dry eyes OU (OD>OS)  - with minimal DME OD, suspect majority of decreased vision OD is due to dry eyes  - BCVA OD improved to 20/30 from 20/40 last visit             - recommend artificial tears QID OU and lubricating ointment as needed     Ophthalmic Meds Ordered this visit:  No orders of the defined types were placed in  this encounter.      Return in about 1 week (around 07/16/2020) for 1 wk f/u for NPDR OU w/DFE/OCT/poss inj. OS.  There are no Patient Instructions on file for this visit.   Explained the diagnoses, plan, and follow up with the patient and they expressed understanding.  Patient expressed understanding of the importance of proper follow up care.   This document serves as a record of services personally performed by Karie ChimeraBrian G. Makaylen Thieme, MD, PhD. It was created on their behalf by Annalee Gentaaryl Barber, COMT. The creation of this record is the provider's dictation and/or activities during the visit.  Electronically signed by: Annalee Gentaaryl Barber, COMT 07/09/20 1:23 PM  This document serves as a record of services personally performed by Karie ChimeraBrian G. Dwane Andres, MD, PhD. It was created on their behalf by Cristopher EstimableAndrew Baxley, COT an ophthalmic technician. The creation of this record is the provider's dictation and/or activities during the visit.    Electronically signed by: Cristopher EstimableAndrew Baxley, COT 9.1.21 @ 1:23 PM  Karie ChimeraBrian G. Kierah Goatley, M.D., Ph.D. Diseases & Surgery of the Retina and Vitreous [facility] 9.1.21  I have reviewed the above documentation for accuracy and completeness, and I agree with the above. Karie ChimeraBrian G. Bernardo Brayman, M.D., Ph.D. 07/09/20 1:23 PM   Abbreviations: M myopia (nearsighted); A astigmatism; H hyperopia (farsighted); P presbyopia; Mrx spectacle prescription;  CTL contact lenses; OD right eye; OS left eye; OU both eyes  XT exotropia; ET esotropia; PEK punctate epithelial keratitis; PEE punctate epithelial erosions; DES dry eye syndrome; MGD meibomian gland dysfunction; ATs artificial tears; PFAT's preservative free artificial tears; NSC nuclear sclerotic cataract; PSC posterior subcapsular cataract; ERM epi-retinal membrane;  PVD posterior vitreous detachment; RD retinal detachment; DM diabetes mellitus; DR diabetic retinopathy; NPDR non-proliferative diabetic retinopathy; PDR proliferative diabetic  retinopathy; CSME clinically significant macular edema; DME diabetic macular edema; dbh dot blot hemorrhages; CWS cotton wool spot; POAG primary open angle glaucoma; C/D cup-to-disc ratio; HVF humphrey visual field; GVF goldmann visual field; OCT optical coherence tomography; IOP intraocular pressure; BRVO Branch retinal vein occlusion; CRVO central retinal vein occlusion; CRAO central retinal artery occlusion; BRAO branch retinal artery occlusion; RT retinal tear; SB scleral buckle; PPV pars plana vitrectomy; VH Vitreous hemorrhage; PRP panretinal laser photocoagulation; IVK intravitreal kenalog; VMT vitreomacular traction; MH Macular hole;  NVD neovascularization of the disc; NVE neovascularization elsewhere; AREDS age related eye disease study; ARMD age related macular degeneration; POAG primary open angle glaucoma; EBMD epithelial/anterior basement membrane dystrophy; ACIOL anterior chamber intraocular lens; IOL intraocular lens; PCIOL posterior chamber intraocular lens; Phaco/IOL phacoemulsification with intraocular lens placement; PRK photorefractive keratectomy; LASIK laser assisted in situ keratomileusis; HTN hypertension; DM diabetes mellitus; COPD chronic obstructive pulmonary disease

## 2020-07-09 ENCOUNTER — Encounter (INDEPENDENT_AMBULATORY_CARE_PROVIDER_SITE_OTHER): Payer: Self-pay | Admitting: Ophthalmology

## 2020-07-09 ENCOUNTER — Other Ambulatory Visit: Payer: Self-pay

## 2020-07-09 ENCOUNTER — Ambulatory Visit (INDEPENDENT_AMBULATORY_CARE_PROVIDER_SITE_OTHER): Payer: Medicare Other | Admitting: Ophthalmology

## 2020-07-09 DIAGNOSIS — H35033 Hypertensive retinopathy, bilateral: Secondary | ICD-10-CM | POA: Diagnosis not present

## 2020-07-09 DIAGNOSIS — Z961 Presence of intraocular lens: Secondary | ICD-10-CM

## 2020-07-09 DIAGNOSIS — H3581 Retinal edema: Secondary | ICD-10-CM

## 2020-07-09 DIAGNOSIS — I10 Essential (primary) hypertension: Secondary | ICD-10-CM | POA: Diagnosis not present

## 2020-07-09 DIAGNOSIS — E113313 Type 2 diabetes mellitus with moderate nonproliferative diabetic retinopathy with macular edema, bilateral: Secondary | ICD-10-CM

## 2020-07-09 DIAGNOSIS — H04123 Dry eye syndrome of bilateral lacrimal glands: Secondary | ICD-10-CM

## 2020-07-15 NOTE — Progress Notes (Signed)
[facility] - Clinic Note  07/16/2020     CHIEF COMPLAINT Patient presents for Retina Follow Up   HISTORY OF PRESENT ILLNESS: Michaela Herrera is a 86 y.o. female who presents to the clinic today for:   HPI    Retina Follow Up    Patient presents with  Diabetic Retinopathy.  In both eyes.  This started 1 week ago.  Severity is moderate.  I, the attending physician,  performed the HPI with the patient and updated documentation appropriately.          Comments    Patient here for 1 week retina follow up for NPDR OU. Patient states vision alright. About the same. No eye pain.       Last edited by Rennis Chris, MD on 07/16/2020 12:18 PM. (History)      Referring physician: Demarco, [place], OD [address] [place],  [place] [address]  HISTORICAL INFORMATION:   Selected notes from the MEDICAL RECORD NUMBER Referred by Dr. [place] DeMarco for concern of DME   CURRENT MEDICATIONS: No current outpatient medications on file. (Ophthalmic Drugs)   No current facility-administered medications for this visit. (Ophthalmic Drugs)   Current Outpatient Medications (Other)  Medication Sig   Alogliptin Benzoate (NESINA) 25 MG TABS Take 25 mg by mouth daily.   amLODipine (NORVASC) 5 MG tablet Take 5 mg by mouth 2 (two) times daily.   b complex vitamins tablet Take 1 tablet by mouth daily.   Cholecalciferol (VITAMIN D PO) Take 1 tablet by mouth daily.   cloNIDine (CATAPRES) 0.2 MG tablet Take 1 tablet (0.2 mg total) by mouth at bedtime.   furosemide (LASIX) 20 MG tablet Take 1 tablet (20 mg total) by mouth every Monday, Wednesday, and Friday.   glipiZIDE (GLUCOTROL XL) 5 MG 24 hr tablet Take 1 tablet (5 mg total) by mouth daily with breakfast.   insulin aspart (NOVOLOG) 100 UNIT/ML injection Inject 0-9 Units into the skin 3 (three) times daily with meals.   isosorbide mononitrate (IMDUR) 60 MG 24 hr tablet Take 60 mg by mouth daily.   losartan (COZAAR) 100  MG tablet Take 100 mg by mouth daily.   metFORMIN (GLUCOPHAGE) 500 MG tablet Take 1,000 mg by mouth 2 (two) times daily with a meal.   metoprolol (LOPRESSOR) 50 MG tablet Take 50 mg by mouth 2 (two) times daily.   potassium chloride (K-DUR,KLOR-CON) 10 MEQ tablet Take 10 mEq by mouth daily.   No current facility-administered medications for this visit. (Other)      REVIEW OF SYSTEMS: ROS    Positive for: Genitourinary, Endocrine, Cardiovascular, Eyes   Negative for: Constitutional, Gastrointestinal, Neurological, Skin, Musculoskeletal, HENT, Respiratory, Psychiatric, Allergic/Imm, Heme/Lymph   Last edited by Laddie Aquas, COA on 07/16/2020 10:00 AM. (History)       ALLERGIES No Known Allergies  PAST MEDICAL HISTORY Past Medical History:  Diagnosis Date   Diabetes mellitus without complication (HCC)    GERD (gastroesophageal reflux disease)    Hypertension    Past Surgical History:  Procedure Laterality Date   APPENDECTOMY     CATARACT EXTRACTION     COLONOSCOPY  2005    FAMILY HISTORY History reviewed. No pertinent family history.  SOCIAL HISTORY Social History   Tobacco Use   Smoking status: Never Smoker   Smokeless tobacco: Never Used  Substance Use Topics   Alcohol use: No   Drug use: No         OPHTHALMIC EXAM:  Base Eye Exam    Visual Acuity (Snellen - Linear)      Right Left   Dist [place] 20/70 20/20   Dist ph [place] 20/25 -2        Tonometry (Tonopen, 9:58 AM)      Right Left   Pressure 14 12       Pupils      Dark Light Shape React APD   Right 3 2 Round Brisk None   Left 3 2 Round Brisk None       Visual Fields (Counting fingers)      Left Right    Full Full       Extraocular Movement      Right Left    Full, Ortho Full, Ortho       Neuro/Psych    Oriented x3: Yes   Mood/Affect: Normal       Dilation    Both eyes: 1.0% Mydriacyl, 2.5% Phenylephrine @ 9:58 AM        Slit Lamp and Fundus Exam    Slit Lamp Exam       Right Left   Lids/Lashes Dermato, mild ptosis Dermato, mild MD   Conjunctiva/Sclera White & quiet, mild inf conj. chalasis Mild melanosis, mild inf conj. chalasis   Cornea Mild arcus, 3+ inf PEE Mild arcuss, 2+ inf PEE   Anterior Chamber Deep & clear Deep and clear   Iris Round & dilated.  No NV. Round and dilated.  No NV.   Lens 3 pc IOL w/open PC; mild superior displacement 3 pc PCIOL in good position.  Open PC.   Vitreous Synerisis Synerisis       Fundus Exam      Right Left   Disc Mild pallor, sharp rim, compact, mild PPA Mild pallor, sharp rim, compact, mild PPA   C/D Ratio 0.2 0.2   Macula Flat, blunted foveal reflex, rpe mottling and clumping, mild cystic changes Blunted foveal reflex, cluster of MA and IRH/SRH temporal to fovea.  Scattered MA elsewhere.  +Edema temporal macula   Vessels Attenuated, mild tortuosity Attenuated, mild tortuosity   Periphery Attached, mild reticular degeneration, scattered DBH Mild reticular degeneration, scattered DBH          IMAGING AND PROCEDURES  Imaging and Procedures for 07/16/2020  OCT, Retina - OU - Both Eyes       Right Eye Quality was good. Central Foveal Thickness: 281. Progression has been stable. Findings include normal foveal contour, no IRF, no SRF, intraretinal hyper-reflective material (Mild cystic changes and prominent IHRM superior macula--stable from prior).   Left Eye Quality was good. Central Foveal Thickness: 304. Progression has been stable. Findings include abnormal foveal contour, intraretinal fluid, no SRF, intraretinal hyper-reflective material (Mild interval increase in IRF/DME temporal and central macula).   Notes *Images captured and stored on drive  Diagnosis / Impression:  DME OU (OS>OD) OD: Mild cystic changes and prominent IHRM superior macula--stable from prior OS: Mild interval increase in IRF/DME temporal and central macula  Clinical management:  See below  Abbreviations: NFP - Normal foveal  profile. CME - cystoid macular edema. PED - pigment epithelial detachment. IRF - intraretinal fluid. SRF - subretinal fluid. EZ - ellipsoid zone. ERM - epiretinal membrane. ORA - outer retinal atrophy. ORT - outer retinal tubulation. SRHM - subretinal hyper-reflective material. IRHM - intraretinal hyper-reflective material        Intravitreal Injection, Pharmacologic Agent - OS - Left Eye       Time Out  07/16/2020. 11:18 AM. Confirmed correct patient, procedure, site, and patient consented.   Anesthesia Topical anesthesia was used. Anesthetic medications included Lidocaine 2%, Proparacaine 0.5%.   Procedure Preparation included eyelid speculum, 5% betadine to ocular surface. A (32g) needle was used.   Injection:  1.25 mg Bevacizumab (AVASTIN) SOLN   NDC: 16109-604-5450242-060-01, Lot: [id number], Expiration date: 07/21/2020   Route: Intravitreal, Site: Left Eye, Waste: 0.05 mL  Post-op Post injection exam found visual acuity of at least counting fingers. The patient tolerated the procedure well. There were no complications. The patient received written and verbal post procedure care education. Post injection medications were not given.                 ASSESSMENT/PLAN:    ICD-10-CM   1. Moderate nonproliferative diabetic retinopathy of both eyes with macular edema associated with type 2 diabetes mellitus (HCC)  Y78.2956E11.3313 Intravitreal Injection, Pharmacologic Agent - OS - Left Eye    Bevacizumab (AVASTIN) SOLN 1.25 mg  2. Retinal edema  H35.81 OCT, Retina - OU - Both Eyes  3. Essential hypertension  I10   4. Hypertensive retinopathy of both eyes  H35.033   5. Pseudophakia of both eyes  Z96.1   6. Dry eyes  H04.123     1,2. Mod non-proliferative diabetic retinopathy OU - exam shows mild cystic changes OD; +DME temporal macula OS - FA (07.09.21) shows mod NPDR OU (OS>OD); late leaking MA OU; No NV OU - OCT  OD: Mild cystic changes and prominent IHRM superior macula--stable from prior              OS: Mild interval increase in IRF/DME temporal and central macula - we had recommended IVA OS #1 on 9.1.21, but pt wished to defer for a week -- here today for injection - recommend IVA OS #1 today, 09.08.21 as DME is worse - pt wishes to proceed with injection  - RBA of procedure discussed, questions answered  - informed consent obtained, signed and scanned, 09.08.21 - see procedure note - suspect blurred vision OD more related to DES rather than diabetic retinopathy - f/u in 4 wk, sooner prn -- DFE/OCT  3,4. Hypertensive retinopathy OU - discussed importance of tight BP control - monitor  5. Pseudophakia OU  - s/p CE/IOL OU  - IOLs in good position, doing well - monitor  6. Dry eyes OU (OD>OS)  - with minimal DME OD, suspect majority of decreased vision OD is due to dry eyes  - BCVA OD improved to 20/25 from 20/30 last wk             - recommend artificial tears QID OU and lubricating ointment as needed     Ophthalmic Meds Ordered this visit:  Meds ordered this encounter  Medications   Bevacizumab (AVASTIN) SOLN 1.25 mg       Return in about 4 weeks (around 08/13/2020) for f/u NPDR OU, DFE, OCT.  There are no Patient Instructions on file for this visit.   Explained the diagnoses, plan, and follow up with the patient and they expressed understanding.  Patient expressed understanding of the importance of proper follow up care.   This document serves as a record of services personally performed by Karie ChimeraBrian G. Marna Weniger, MD, PhD. It was created on their behalf by Annalee Gentaaryl Barber, COMT. The creation of this record is the provider's dictation and/or activities during the visit.  Electronically signed by: Annalee Gentaaryl Barber, COMT 07/16/20 12:22 PM   This document serves as a  record of services personally performed by Karie Chimera, MD, PhD. It was created on their behalf by Glee Arvin. Manson Passey, OA an ophthalmic technician. The creation of this record is the provider's dictation and/or  activities during the visit.    Electronically signed by: Glee Arvin. Manson Passey, [place] 09.08.2021 12:22 PM  Karie Chimera, M.D., Ph.D. Diseases & Surgery of the Retina and Vitreous [facility]  I have reviewed the above documentation for accuracy and completeness, and I agree with the above. Karie Chimera, M.D., Ph.D. 07/16/20 12:22 PM   Abbreviations: M myopia (nearsighted); A astigmatism; H hyperopia (farsighted); P presbyopia; Mrx spectacle prescription;  CTL contact lenses; OD right eye; OS left eye; OU both eyes  XT exotropia; ET esotropia; PEK punctate epithelial keratitis; PEE punctate epithelial erosions; DES dry eye syndrome; MGD meibomian gland dysfunction; ATs artificial tears; PFAT's preservative free artificial tears; NSC nuclear sclerotic cataract; PSC posterior subcapsular cataract; ERM epi-retinal membrane; PVD posterior vitreous detachment; RD retinal detachment; DM diabetes mellitus; DR diabetic retinopathy; NPDR non-proliferative diabetic retinopathy; PDR proliferative diabetic retinopathy; CSME clinically significant macular edema; DME diabetic macular edema; dbh dot blot hemorrhages; CWS cotton wool spot; POAG primary open angle glaucoma; C/D cup-to-disc ratio; HVF humphrey visual field; GVF goldmann visual field; OCT optical coherence tomography; IOP intraocular pressure; BRVO Branch retinal vein occlusion; CRVO central retinal vein occlusion; CRAO central retinal artery occlusion; BRAO branch retinal artery occlusion; RT retinal tear; SB scleral buckle; PPV pars plana vitrectomy; VH Vitreous hemorrhage; PRP panretinal laser photocoagulation; IVK intravitreal kenalog; VMT vitreomacular traction; MH Macular hole;  NVD neovascularization of the disc; NVE neovascularization elsewhere; AREDS age related eye disease study; ARMD age related macular degeneration; POAG primary open angle glaucoma; EBMD epithelial/anterior basement membrane dystrophy; ACIOL anterior chamber  intraocular lens; IOL intraocular lens; PCIOL posterior chamber intraocular lens; Phaco/IOL phacoemulsification with intraocular lens placement; PRK photorefractive keratectomy; LASIK laser assisted in situ keratomileusis; HTN hypertension; DM diabetes mellitus; COPD chronic obstructive pulmonary disease

## 2020-07-16 ENCOUNTER — Ambulatory Visit (INDEPENDENT_AMBULATORY_CARE_PROVIDER_SITE_OTHER): Payer: Medicare Other | Admitting: Ophthalmology

## 2020-07-16 ENCOUNTER — Encounter (INDEPENDENT_AMBULATORY_CARE_PROVIDER_SITE_OTHER): Payer: Self-pay | Admitting: Ophthalmology

## 2020-07-16 ENCOUNTER — Other Ambulatory Visit: Payer: Self-pay

## 2020-07-16 DIAGNOSIS — H3581 Retinal edema: Secondary | ICD-10-CM

## 2020-07-16 DIAGNOSIS — Z961 Presence of intraocular lens: Secondary | ICD-10-CM

## 2020-07-16 DIAGNOSIS — E113313 Type 2 diabetes mellitus with moderate nonproliferative diabetic retinopathy with macular edema, bilateral: Secondary | ICD-10-CM

## 2020-07-16 DIAGNOSIS — H04123 Dry eye syndrome of bilateral lacrimal glands: Secondary | ICD-10-CM

## 2020-07-16 DIAGNOSIS — I10 Essential (primary) hypertension: Secondary | ICD-10-CM

## 2020-07-16 DIAGNOSIS — H35033 Hypertensive retinopathy, bilateral: Secondary | ICD-10-CM

## 2020-07-16 MED ORDER — BEVACIZUMAB CHEMO INJECTION 1.25MG/0.05ML SYRINGE FOR KALEIDOSCOPE
1.2500 mg | INTRAVITREAL | Status: AC | PRN
  Administered 2020-07-16: 1.25 mg via INTRAVITREAL

## 2020-08-11 NOTE — Progress Notes (Signed)
[facility] - Clinic Note  08/13/2020     CHIEF COMPLAINT Patient presents for Retina Follow Up   HISTORY OF PRESENT ILLNESS: Michaela Herrera is a 86 y.o. female who presents to the clinic today for:   HPI    Retina Follow Up    Patient presents with  Diabetic Retinopathy.  In both eyes.  This started 4 weeks ago.  I, the attending physician,  performed the HPI with the patient and updated documentation appropriately.          Comments    Patient here for 4 weeks retina follow up for NPDR OU. Patient states vision doing ok. No eye pain.        Last edited by Rennis Chris, MD on 08/13/2020 12:56 PM. (History)      Referring physician: [name], MD [address] [place],  [place] [address]  HISTORICAL INFORMATION:   Selected notes from the MEDICAL RECORD NUMBER Referred by Dr. [place] DeMarco for concern of DME   CURRENT MEDICATIONS: No current outpatient medications on file. (Ophthalmic Drugs)   No current facility-administered medications for this visit. (Ophthalmic Drugs)   Current Outpatient Medications (Other)  Medication Sig  . Alogliptin Benzoate (NESINA) 25 MG TABS Take 25 mg by mouth daily.  Marland Kitchen amLODipine (NORVASC) 5 MG tablet Take 5 mg by mouth 2 (two) times daily.  Marland Kitchen b complex vitamins tablet Take 1 tablet by mouth daily.  . Cholecalciferol (VITAMIN D PO) Take 1 tablet by mouth daily.  . cloNIDine (CATAPRES) 0.2 MG tablet Take 1 tablet (0.2 mg total) by mouth at bedtime.  . furosemide (LASIX) 20 MG tablet Take 1 tablet (20 mg total) by mouth every Monday, Wednesday, and Friday.  Marland Kitchen glipiZIDE (GLUCOTROL XL) 5 MG 24 hr tablet Take 1 tablet (5 mg total) by mouth daily with breakfast.  . insulin aspart (NOVOLOG) 100 UNIT/ML injection Inject 0-9 Units into the skin 3 (three) times daily with meals.  . isosorbide mononitrate (IMDUR) 60 MG 24 hr tablet Take 60 mg by mouth daily.  Marland Kitchen losartan (COZAAR) 100 MG tablet Take 100 mg by mouth  daily.  . metFORMIN (GLUCOPHAGE) 500 MG tablet Take 1,000 mg by mouth 2 (two) times daily with a meal.  . metoprolol (LOPRESSOR) 50 MG tablet Take 50 mg by mouth 2 (two) times daily.  . potassium chloride (K-DUR,KLOR-CON) 10 MEQ tablet Take 10 mEq by mouth daily.   No current facility-administered medications for this visit. (Other)      REVIEW OF SYSTEMS: ROS    Positive for: Genitourinary, Endocrine, Cardiovascular, Eyes   Negative for: Constitutional, Gastrointestinal, Neurological, Skin, Musculoskeletal, HENT, Respiratory, Psychiatric, Allergic/Imm, Heme/Lymph   Last edited by Laddie Aquas, COA on 08/13/2020  9:46 AM. (History)       ALLERGIES No Known Allergies  PAST MEDICAL HISTORY Past Medical History:  Diagnosis Date  . Diabetes mellitus without complication (HCC)   . GERD (gastroesophageal reflux disease)   . Hypertension    Past Surgical History:  Procedure Laterality Date  . APPENDECTOMY    . CATARACT EXTRACTION    . COLONOSCOPY  2005    FAMILY HISTORY History reviewed. No pertinent family history.  SOCIAL HISTORY Social History   Tobacco Use  . Smoking status: Never Smoker  . Smokeless tobacco: Never Used  Substance Use Topics  . Alcohol use: No  . Drug use: No         OPHTHALMIC EXAM:  Base Eye  Exam    Visual Acuity (Snellen - Linear)      Right Left   Dist [place] 20/50 20/30 -2   Dist ph [place] 20/40 20/25 -2       Tonometry (Tonopen, 9:44 AM)      Right Left   Pressure 15 15       Pupils      Dark Light Shape React APD   Right 3 2 Round Brisk None   Left 3 2 Round Brisk None       Visual Fields (Counting fingers)      Left Right    Full Full       Extraocular Movement      Right Left    Full Full       Neuro/Psych    Oriented x3: Yes   Mood/Affect: Normal       Dilation    Both eyes: 1.0% Mydriacyl, 2.5% Phenylephrine @ 9:44 AM        Slit Lamp and Fundus Exam    Slit Lamp Exam      Right Left   Lids/Lashes  Dermato, mild ptosis Dermato, mild MD   Conjunctiva/Sclera White & quiet, mild inf conj. chalasis Mild melanosis, mild inf conj. chalasis   Cornea Mild arcus, 3+ inf PEE Mild arcuss, 2+ inf PEE   Anterior Chamber Deep & clear Deep and clear   Iris Round & dilated.  No NV. Round and dilated.  No NV.   Lens 3 pc IOL w/open PC; mild superior displacement 3 pc PCIOL in good position.  Open PC.   Vitreous Synerisis Synerisis       Fundus Exam      Right Left   Disc Mild pallor, sharp rim, compact, mild PPA Mild pallor, sharp rim, compact, mild PPA   C/D Ratio 0.2 0.2   Macula Flat, blunted foveal reflex, rpe mottling and clumping, mild cystic changes, focal IRH/DBH temporal to fovea Blunted foveal reflex, cluster of MA and IRH/SRH temporal to fovea.  Scattered MA elsewhere.  +Edema temporal macula   Vessels Attenuated, mild tortuosity Attenuated, mild tortuosity   Periphery Attached, mild reticular degeneration, scattered DBH Attached, Mild reticular degeneration, scattered DBH          IMAGING AND PROCEDURES  Imaging and Procedures for 08/13/2020  OCT, Retina - OU - Both Eyes       Right Eye Quality was good. Central Foveal Thickness: 285. Progression has been stable. Findings include normal foveal contour, no IRF, no SRF, intraretinal hyper-reflective material (Mild cystic changes and prominent IHRM superior macula--persistent).   Left Eye Quality was good. Central Foveal Thickness: 312. Progression has been stable. Findings include abnormal foveal contour, intraretinal fluid, no SRF, intraretinal hyper-reflective material (persistent IRF/DME temporal and central macula).   Notes *Images captured and stored on drive  Diagnosis / Impression:  DME OU (OS>OD) OD: Mild cystic changes and prominent IHRM superior macula--persistent OS: persistent IRF/DME temporal and central macula  Clinical management:  See below  Abbreviations: NFP - Normal foveal profile. CME - cystoid macular  edema. PED - pigment epithelial detachment. IRF - intraretinal fluid. SRF - subretinal fluid. EZ - ellipsoid zone. ERM - epiretinal membrane. ORA - outer retinal atrophy. ORT - outer retinal tubulation. SRHM - subretinal hyper-reflective material. IRHM - intraretinal hyper-reflective material        Intravitreal Injection, Pharmacologic Agent - OS - Left Eye       Time Out 08/13/2020. 10:48 AM. Confirmed correct patient, procedure,  site, and patient consented.   Anesthesia Topical anesthesia was used. Anesthetic medications included Lidocaine 2%, Proparacaine 0.5%.   Procedure Preparation included eyelid speculum, 5% betadine to ocular surface. A (32g) needle was used.   Injection:  1.25 mg Bevacizumab (AVASTIN) SOLN   NDC: 93716-967-89, Lot: [id number], Expiration date: 09/14/2020   Route: Intravitreal, Site: Left Eye, Waste: 0.05 mL  Post-op Post injection exam found visual acuity of at least counting fingers. The patient tolerated the procedure well. There were no complications. The patient received written and verbal post procedure care education. Post injection medications were not given.                 ASSESSMENT/PLAN:    ICD-10-CM   1. Moderate nonproliferative diabetic retinopathy of both eyes with macular edema associated with type 2 diabetes mellitus (HCC)  Z02.5852 Intravitreal Injection, Pharmacologic Agent - OS - Left Eye    Bevacizumab (AVASTIN) SOLN 1.25 mg  2. Retinal edema  H35.81 OCT, Retina - OU - Both Eyes  3. Essential hypertension  I10   4. Hypertensive retinopathy of both eyes  H35.033   5. Pseudophakia of both eyes  Z96.1   6. Dry eyes  H04.123     1,2. Mod non-proliferative diabetic retinopathy OU - exam shows mild cystic changes OD; +DME temporal macula OS - s/p IVA OS #1 (09.08.21) - FA (07.09.21) shows mod NPDR OU (OS>OD); late leaking MA OU; No NV OU - OCT  OD: Mild cystic changes and prominent IHRM superior macula--persistent              OS: persistent IRF/DME temporal and central macula - recommend IVA OS #2 today, 10.06.21  - pt wishes to proceed with injection  - RBA of procedure discussed, questions answered  - informed consent obtained, signed and scanned, 09.08.21 - see procedure note - suspect blurred vision OD more related to DES rather than diabetic retinopathy - f/u in 4 wk, sooner prn -- DFE/OCT  3,4. Hypertensive retinopathy OU - discussed importance of tight BP control - monitor  5. Pseudophakia OU  - s/p CE/IOL OU  - IOLs in good position, doing well - monitor  6. Dry eyes OU (OD>OS)  - with minimal DME OD, suspect majority of decreased vision OD is due to dry eyes             - recommend artificial tears QID and lubricating ointment as needed   Ophthalmic Meds Ordered this visit:  Meds ordered this encounter  Medications  . Bevacizumab (AVASTIN) SOLN 1.25 mg       Return in about 4 weeks (around 09/10/2020) for f/u NPDR OU, DFE, OCT.  There are no Patient Instructions on file for this visit.   Explained the diagnoses, plan, and follow up with the patient and they expressed understanding.  Patient expressed understanding of the importance of proper follow up care.   This document serves as a record of services personally performed by Karie Chimera, MD, PhD. It was created on their behalf by Annalee Genta, COMT. The creation of this record is the provider's dictation and/or activities during the visit.  Electronically signed by: Annalee Genta, COMT 08/13/20 12:58 PM   This document serves as a record of services personally performed by Karie Chimera, MD, PhD. It was created on their behalf by Glee Arvin. Manson Passey, OA an ophthalmic technician. The creation of this record is the provider's dictation and/or activities during the visit.    Electronically signed by:  Glee Arvin. Manson Passey, [place] 10.06.2021 12:58 PM  Karie Chimera, M.D., Ph.D. Diseases & Surgery of the Retina and Vitreous [facility]  I have reviewed the above documentation for accuracy and completeness, and I agree with the above. Karie Chimera, M.D., Ph.D. 08/13/20 12:58 PM   Abbreviations: M myopia (nearsighted); A astigmatism; H hyperopia (farsighted); P presbyopia; Mrx spectacle prescription;  CTL contact lenses; OD right eye; OS left eye; OU both eyes  XT exotropia; ET esotropia; PEK punctate epithelial keratitis; PEE punctate epithelial erosions; DES dry eye syndrome; MGD meibomian gland dysfunction; ATs artificial tears; PFAT's preservative free artificial tears; NSC nuclear sclerotic cataract; PSC posterior subcapsular cataract; ERM epi-retinal membrane; PVD posterior vitreous detachment; RD retinal detachment; DM diabetes mellitus; DR diabetic retinopathy; NPDR non-proliferative diabetic retinopathy; PDR proliferative diabetic retinopathy; CSME clinically significant macular edema; DME diabetic macular edema; dbh dot blot hemorrhages; CWS cotton wool spot; POAG primary open angle glaucoma; C/D cup-to-disc ratio; HVF humphrey visual field; GVF goldmann visual field; OCT optical coherence tomography; IOP intraocular pressure; BRVO Branch retinal vein occlusion; CRVO central retinal vein occlusion; CRAO central retinal artery occlusion; BRAO branch retinal artery occlusion; RT retinal tear; SB scleral buckle; PPV pars plana vitrectomy; VH Vitreous hemorrhage; PRP panretinal laser photocoagulation; IVK intravitreal kenalog; VMT vitreomacular traction; MH Macular hole;  NVD neovascularization of the disc; NVE neovascularization elsewhere; AREDS age related eye disease study; ARMD age related macular degeneration; POAG primary open angle glaucoma; EBMD epithelial/anterior basement membrane dystrophy; ACIOL anterior chamber intraocular lens; IOL intraocular lens; PCIOL posterior chamber intraocular lens; Phaco/IOL phacoemulsification with intraocular lens placement; PRK photorefractive keratectomy; LASIK laser  assisted in situ keratomileusis; HTN hypertension; DM diabetes mellitus; COPD chronic obstructive pulmonary disease

## 2020-08-13 ENCOUNTER — Ambulatory Visit (INDEPENDENT_AMBULATORY_CARE_PROVIDER_SITE_OTHER): Payer: Medicare Other | Admitting: Ophthalmology

## 2020-08-13 ENCOUNTER — Encounter (INDEPENDENT_AMBULATORY_CARE_PROVIDER_SITE_OTHER): Payer: Self-pay | Admitting: Ophthalmology

## 2020-08-13 ENCOUNTER — Other Ambulatory Visit: Payer: Self-pay

## 2020-08-13 DIAGNOSIS — I10 Essential (primary) hypertension: Secondary | ICD-10-CM | POA: Diagnosis not present

## 2020-08-13 DIAGNOSIS — H35033 Hypertensive retinopathy, bilateral: Secondary | ICD-10-CM | POA: Diagnosis not present

## 2020-08-13 DIAGNOSIS — H3581 Retinal edema: Secondary | ICD-10-CM | POA: Diagnosis not present

## 2020-08-13 DIAGNOSIS — Z961 Presence of intraocular lens: Secondary | ICD-10-CM

## 2020-08-13 DIAGNOSIS — E113313 Type 2 diabetes mellitus with moderate nonproliferative diabetic retinopathy with macular edema, bilateral: Secondary | ICD-10-CM | POA: Diagnosis not present

## 2020-08-13 DIAGNOSIS — H04123 Dry eye syndrome of bilateral lacrimal glands: Secondary | ICD-10-CM

## 2020-08-13 MED ORDER — BEVACIZUMAB CHEMO INJECTION 1.25MG/0.05ML SYRINGE FOR KALEIDOSCOPE
1.2500 mg | INTRAVITREAL | Status: AC | PRN
  Administered 2020-08-13: 1.25 mg via INTRAVITREAL

## 2020-09-08 NOTE — Progress Notes (Signed)
[facility] - Clinic Note  09/10/2020     CHIEF COMPLAINT Patient presents for Retina Follow Up   HISTORY OF PRESENT ILLNESS: Michaela Herrera is a 86 y.o. female who presents to the clinic today for:   HPI    Retina Follow Up    Patient presents with  Diabetic Retinopathy.  In both eyes.  This started 4 weeks ago.  I, the attending physician,  performed the HPI with the patient and updated documentation appropriately.          Comments    Patient here for 4 weeks retina follow up for NPDR OU. Patient states vision doing alright. Itches at times. No eye pain.       Last edited by Rennis ChrisZamora, Harlan Vinal, MD on 09/12/2020 12:07 AM. (History)      Referring physician: Orpah CobbKadakia, Ajay, MD [address] [place] [id number]  HISTORICAL INFORMATION:   Selected notes from the MEDICAL RECORD NUMBER Referred by Dr. [place]azil[place] DeMarco for concern of DME   CURRENT MEDICATIONS: No current outpatient medications on file. (Ophthalmic Drugs)   No current facility-administered medications for this visit. (Ophthalmic Drugs)   Current Outpatient Medications (Other)  Medication Sig  . Alogliptin Benzoate (NESINA) 25 MG TABS Take 25 mg by mouth daily.  Marland Kitchen. amLODipine (NORVASC) 5 MG tablet Take 5 mg by mouth 2 (two) times daily.  Marland Kitchen. b complex vitamins tablet Take 1 tablet by mouth daily.  . Cholecalciferol (VITAMIN D PO) Take 1 tablet by mouth daily.  . cloNIDine (CATAPRES) 0.2 MG tablet Take 1 tablet (0.2 mg total) by mouth at bedtime.  . furosemide (LASIX) 20 MG tablet Take 1 tablet (20 mg total) by mouth every Monday, Wednesday, and Friday.  Marland Kitchen. glipiZIDE (GLUCOTROL XL) 5 MG 24 hr tablet Take 1 tablet (5 mg total) by mouth daily with breakfast.  . insulin aspart (NOVOLOG) 100 UNIT/ML injection Inject 0-9 Units into the skin 3 (three) times daily with meals.  . isosorbide mononitrate (IMDUR) 60 MG 24 hr tablet Take 60 mg by mouth daily.  Marland Kitchen. losartan (COZAAR) 100 MG tablet Take  100 mg by mouth daily.  . metFORMIN (GLUCOPHAGE) 500 MG tablet Take 1,000 mg by mouth 2 (two) times daily with a meal.  . metoprolol (LOPRESSOR) 50 MG tablet Take 50 mg by mouth 2 (two) times daily.  . potassium chloride (K-DUR,KLOR-CON) 10 MEQ tablet Take 10 mEq by mouth daily.   No current facility-administered medications for this visit. (Other)      REVIEW OF SYSTEMS: ROS    Positive for: Genitourinary, Endocrine, Cardiovascular, Eyes   Negative for: Constitutional, Gastrointestinal, Neurological, Skin, Musculoskeletal, HENT, Respiratory, Psychiatric, Allergic/Imm, Heme/Lymph   Last edited by Laddie Aquaslarke, Rebecca S, COA on 09/10/2020  9:41 AM. (History)       ALLERGIES No Known Allergies  PAST MEDICAL HISTORY Past Medical History:  Diagnosis Date  . Diabetes mellitus without complication (HCC)   . GERD (gastroesophageal reflux disease)   . Hypertension    Past Surgical History:  Procedure Laterality Date  . APPENDECTOMY    . CATARACT EXTRACTION    . COLONOSCOPY  2005    FAMILY HISTORY History reviewed. No pertinent family history.  SOCIAL HISTORY Social History   Tobacco Use  . Smoking status: Never Smoker  . Smokeless tobacco: Never Used  Substance Use Topics  . Alcohol use: No  . Drug use: No         OPHTHALMIC EXAM:  Base Eye Exam    Visual Acuity (Snellen - Linear)      Right Left   Dist [place] 20/70 -2 20/30 -1   Dist ph [place] 20/40 20/20 -2       Tonometry (Tonopen, 9:38 AM)      Right Left   Pressure 18 16       Pupils      Dark Light Shape React APD   Right 3 2 Round Brisk None   Left 3 2 Round Brisk None       Visual Fields (Counting fingers)      Left Right    Full Full       Extraocular Movement      Right Left    Full Full       Neuro/Psych    Oriented x3: Yes   Mood/Affect: Normal       Dilation    Both eyes: 1.0% Mydriacyl, 2.5% Phenylephrine @ 9:38 AM        Slit Lamp and Fundus Exam    Slit Lamp Exam      Right Left    Lids/Lashes Dermato, mild ptosis Dermato, mild MD   Conjunctiva/Sclera White & quiet, mild inf conj. chalasis Mild melanosis, mild inf conj. chalasis   Cornea Mild arcus, 3+ inf PEE Mild arcuss, 2+ inf PEE   Anterior Chamber Deep & clear Deep and clear   Iris Round & dilated.  No NV. Round and dilated.  No NV.   Lens 3 pc IOL w/open PC; mild superior displacement 3 pc PCIOL in good position.  Open PC.   Vitreous Synerisis Synerisis       Fundus Exam      Right Left   Disc Mild pallor, sharp rim, compact, mild PPA Mild pallor, sharp rim, compact, mild PPA   C/D Ratio 0.3 0.1   Macula Flat, blunted foveal reflex, rpe mottling and clumping, mild cystic changes,scattered  focal MA/IRH/ -- prominent IRH temporal to fovea -- stable from prior Blunted foveal reflex, cluster of MA and IRH/SRH temporal to fovea -- improving  Scattered MA elsewhere.  +Edema temporal macula -- central cystic changes improving   Vessels Attenuated, mild tortuosity Attenuated, mild tortuosity   Periphery Attached, mild reticular degeneration, scattered DBH Attached, Mild reticular degeneration, scattered DBH          IMAGING AND PROCEDURES  Imaging and Procedures for 09/10/2020  OCT, Retina - OU - Both Eyes       Right Eye Quality was good. Central Foveal Thickness: 285. Progression has been stable. Findings include normal foveal contour, no IRF, no SRF, intraretinal hyper-reflective material (Mild cystic changes and prominent IHRM superior macula--persistent).   Left Eye Quality was good. Central Foveal Thickness: 302. Progression has improved. Findings include abnormal foveal contour, intraretinal fluid, no SRF, intraretinal hyper-reflective material (Mild interval improvement in IRF centrally and temporal macula).   Notes *Images captured and stored on drive  Diagnosis / Impression:  DME OU (OS>OD) OD: Mild cystic changes and prominent IHRM superior macula--persistent OS: Mild interval improvement in  IRF centrally and temporal macula  Clinical management:  See below  Abbreviations: NFP - Normal foveal profile. CME - cystoid macular edema. PED - pigment epithelial detachment. IRF - intraretinal fluid. SRF - subretinal fluid. EZ - ellipsoid zone. ERM - epiretinal membrane. ORA - outer retinal atrophy. ORT - outer retinal tubulation. SRHM - subretinal hyper-reflective material. IRHM - intraretinal hyper-reflective material        Intravitreal  Injection, Pharmacologic Agent - OS - Left Eye       Time Out 09/10/2020. 11:07 AM. Confirmed correct patient, procedure, site, and patient consented.   Anesthesia Topical anesthesia was used. Anesthetic medications included Lidocaine 2%, Proparacaine 0.5%.   Procedure Preparation included eyelid speculum, 5% betadine to ocular surface. A (32g) needle was used.   Injection:  1.25 mg Bevacizumab (AVASTIN) SOLN   NDC: 40814-481-85, Lot: [id number] , Expiration date: 09/16/2020   Route: Intravitreal, Site: Left Eye, Waste: 0 mL  Post-op Post injection exam found visual acuity of at least counting fingers. The patient tolerated the procedure well. There were no complications. The patient received written and verbal post procedure care education. Post injection medications were not given.                 ASSESSMENT/PLAN:    ICD-10-CM   1. Moderate nonproliferative diabetic retinopathy of both eyes with macular edema associated with type 2 diabetes mellitus (HCC)  13/07/2020 Intravitreal Injection, Pharmacologic Agent - OS - Left Eye    Bevacizumab (AVASTIN) SOLN 1.25 mg  2. Retinal edema  H35.81 OCT, Retina - OU - Both Eyes  3. Essential hypertension  I10   4. Hypertensive retinopathy of both eyes  H35.033   5. Pseudophakia of both eyes  Z96.1   6. Dry eyes  H04.123     1,2. Mod non-proliferative diabetic retinopathy OU - exam shows mild cystic changes OD; +DME temporal macula OS - s/p IVA OS #1 (09.08.21), #2 (10.06.21) - FA  (07.09.21) shows mod NPDR OU (OS>OD); late leaking MA OU; No NV OU - OCT  OD: Mild cystic changes and prominent IHRM superior macula--persistent             OS: Mild interval improvement in IRF centrally and temporal macula - recommend IVA OS #3 today, 11.03.21 with extension to 5 weeks - pt wishes to proceed with injection  - RBA of procedure discussed, questions answered  - informed consent obtained, signed and scanned, 09.08.21 - see procedure note - suspect blurred vision OD more related to DES rather than diabetic retinopathy - f/u in 5 wk, sooner prn -- DFE/OCT  3,4. Hypertensive retinopathy OU - discussed importance of tight BP control - monitor  5. Pseudophakia OU  - s/p CE/IOL OU  - IOLs in good position, doing well - monitor  6. Dry eyes OU (OD>OS)  - with minimal DME OD, suspect majority of decreased vision OD is due to dry eyes             - recommend artificial tears QID OU and lubricating ointment as needed   Ophthalmic Meds Ordered this visit:  Meds ordered this encounter  Medications  . Bevacizumab (AVASTIN) SOLN 1.25 mg       Return in about 5 weeks (around 10/15/2020) for f/u NPDR OU, DFE, OCT.  There are no Patient Instructions on file for this visit.   Explained the diagnoses, plan, and follow up with the patient and they expressed understanding.  Patient expressed understanding of the importance of proper follow up care.   This document serves as a record of services personally performed by 14/06/2020, MD, PhD. It was created on their behalf by Karie Chimera, COMT. The creation of this record is the provider's dictation and/or activities during the visit.  Electronically signed by: Annalee Genta, COMT 09/12/20 12:09 AM   This document serves as a record of services personally performed by 13/05/21, MD, PhD. It  was created on their behalf by Glee Arvin. Manson Passey, OA an ophthalmic technician. The creation of this record is the provider's dictation  and/or activities during the visit.    Electronically signed by: Glee Arvin. Manson Passey, [place] 11.03.2021 12:09 AM  Karie Chimera, M.D., Ph.D. Diseases & Surgery of the Retina and Vitreous [facility]  I have reviewed the above documentation for accuracy and completeness, and I agree with the above. Karie Chimera, M.D., Ph.D. 09/12/20 12:09 AM   Abbreviations: M myopia (nearsighted); A astigmatism; H hyperopia (farsighted); P presbyopia; Mrx spectacle prescription;  CTL contact lenses; OD right eye; OS left eye; OU both eyes  XT exotropia; ET esotropia; PEK punctate epithelial keratitis; PEE punctate epithelial erosions; DES dry eye syndrome; MGD meibomian gland dysfunction; ATs artificial tears; PFAT's preservative free artificial tears; NSC nuclear sclerotic cataract; PSC posterior subcapsular cataract; ERM epi-retinal membrane; PVD posterior vitreous detachment; RD retinal detachment; DM diabetes mellitus; DR diabetic retinopathy; NPDR non-proliferative diabetic retinopathy; PDR proliferative diabetic retinopathy; CSME clinically significant macular edema; DME diabetic macular edema; dbh dot blot hemorrhages; CWS cotton wool spot; POAG primary open angle glaucoma; C/D cup-to-disc ratio; HVF humphrey visual field; GVF goldmann visual field; OCT optical coherence tomography; IOP intraocular pressure; BRVO Branch retinal vein occlusion; CRVO central retinal vein occlusion; CRAO central retinal artery occlusion; BRAO branch retinal artery occlusion; RT retinal tear; SB scleral buckle; PPV pars plana vitrectomy; VH Vitreous hemorrhage; PRP panretinal laser photocoagulation; IVK intravitreal kenalog; VMT vitreomacular traction; MH Macular hole;  NVD neovascularization of the disc; NVE neovascularization elsewhere; AREDS age related eye disease study; ARMD age related macular degeneration; POAG primary open angle glaucoma; EBMD epithelial/anterior basement membrane dystrophy; ACIOL anterior  chamber intraocular lens; IOL intraocular lens; PCIOL posterior chamber intraocular lens; Phaco/IOL phacoemulsification with intraocular lens placement; PRK photorefractive keratectomy; LASIK laser assisted in situ keratomileusis; HTN hypertension; DM diabetes mellitus; COPD chronic obstructive pulmonary disease

## 2020-09-10 ENCOUNTER — Encounter (INDEPENDENT_AMBULATORY_CARE_PROVIDER_SITE_OTHER): Payer: Self-pay | Admitting: Ophthalmology

## 2020-09-10 ENCOUNTER — Other Ambulatory Visit: Payer: Self-pay

## 2020-09-10 ENCOUNTER — Ambulatory Visit (INDEPENDENT_AMBULATORY_CARE_PROVIDER_SITE_OTHER): Payer: Medicare Other | Admitting: Ophthalmology

## 2020-09-10 DIAGNOSIS — H35033 Hypertensive retinopathy, bilateral: Secondary | ICD-10-CM

## 2020-09-10 DIAGNOSIS — E113313 Type 2 diabetes mellitus with moderate nonproliferative diabetic retinopathy with macular edema, bilateral: Secondary | ICD-10-CM

## 2020-09-10 DIAGNOSIS — I10 Essential (primary) hypertension: Secondary | ICD-10-CM

## 2020-09-10 DIAGNOSIS — H04123 Dry eye syndrome of bilateral lacrimal glands: Secondary | ICD-10-CM

## 2020-09-10 DIAGNOSIS — Z961 Presence of intraocular lens: Secondary | ICD-10-CM

## 2020-09-10 DIAGNOSIS — H3581 Retinal edema: Secondary | ICD-10-CM

## 2020-09-12 ENCOUNTER — Encounter (INDEPENDENT_AMBULATORY_CARE_PROVIDER_SITE_OTHER): Payer: Self-pay | Admitting: Ophthalmology

## 2020-09-12 DIAGNOSIS — E113313 Type 2 diabetes mellitus with moderate nonproliferative diabetic retinopathy with macular edema, bilateral: Secondary | ICD-10-CM

## 2020-09-12 MED ORDER — BEVACIZUMAB CHEMO INJECTION 1.25MG/0.05ML SYRINGE FOR KALEIDOSCOPE
1.2500 mg | INTRAVITREAL | Status: AC | PRN
  Administered 2020-09-12: 1.25 mg via INTRAVITREAL

## 2020-10-13 NOTE — Progress Notes (Signed)
[facility] - Clinic Note  10/15/2020     CHIEF COMPLAINT Patient presents for Retina Follow Up   HISTORY OF PRESENT ILLNESS: Michaela Herrera is a 86 y.o. female who presents to the clinic today for:   HPI    Retina Follow Up    Patient presents with  Diabetic Retinopathy.  In both eyes.  This started months ago.  Severity is moderate.  Duration of 5 weeks.  Since onset it is gradually improving.  I, the attending physician,  performed the HPI with the patient and updated documentation appropriately.          Comments    86 y/o female pt here for 5 wk f/u for NPDR w/DME OU.  No change in [place] O[place] noticed.  Denies pain, FOL, floaters.  No gtts.  Eyes often feel dry and irritated.  BS and A1C unknown.       Last edited by Rennis ChrisZamora, Isaac Lacson, MD on 10/16/2020 10:29 PM. (History)      Referring physician: Orpah CobbKadakia, Ajay, MD [address] [place] [id number]  HISTORICAL INFORMATION:   Selected notes from the MEDICAL RECORD NUMBER Referred by Dr. SwazilandJordan DeMarco for concern of DME   CURRENT MEDICATIONS: No current outpatient medications on file. (Ophthalmic Drugs)   No current facility-administered medications for this visit. (Ophthalmic Drugs)   Current Outpatient Medications (Other)  Medication Sig  . Alogliptin Benzoate (NESINA) 25 MG TABS Take 25 mg by mouth daily.  Marland Kitchen. amLODipine (NORVASC) 5 MG tablet Take 5 mg by mouth 2 (two) times daily.  Marland Kitchen. atorvastatin (LIPITOR) 10 MG tablet Take 10 mg by mouth 3 (three) times a week.  Marland Kitchen. b complex vitamins tablet Take 1 tablet by mouth daily.  . Cholecalciferol (VITAMIN D PO) Take 1 tablet by mouth daily.  . cloNIDine (CATAPRES) 0.2 MG tablet Take 1 tablet (0.2 mg total) by mouth at bedtime.  . furosemide (LASIX) 20 MG tablet Take 1 tablet (20 mg total) by mouth every Monday, Wednesday, and Friday.  Marland Kitchen. glipiZIDE (GLUCOTROL XL) 5 MG 24 hr tablet Take 1 tablet (5 mg total) by mouth daily with breakfast.  . insulin  aspart (NOVOLOG) 100 UNIT/ML injection Inject 0-9 Units into the skin 3 (three) times daily with meals.  . isosorbide mononitrate (IMDUR) 60 MG 24 hr tablet Take 60 mg by mouth daily.  Marland Kitchen. JANUVIA 100 MG tablet Take 100 mg by mouth daily.  Marland Kitchen. losartan (COZAAR) 100 MG tablet Take 100 mg by mouth daily.  . metFORMIN (GLUCOPHAGE) 500 MG tablet Take 1,000 mg by mouth 2 (two) times daily with a meal.  . metoprolol (LOPRESSOR) 50 MG tablet Take 50 mg by mouth 2 (two) times daily.  . potassium chloride (K-DUR,KLOR-CON) 10 MEQ tablet Take 10 mEq by mouth daily.  . potassium chloride (KLOR-CON) 10 MEQ tablet Take 10 mEq by mouth daily.   No current facility-administered medications for this visit. (Other)      REVIEW OF SYSTEMS: ROS    Positive for: Gastrointestinal, Endocrine, Eyes   Negative for: Constitutional, Neurological, Skin, Genitourinary, Musculoskeletal, HENT, Cardiovascular, Respiratory, Psychiatric, Allergic/Imm, Heme/Lymph   Last edited by Celine MansBaxley, Andrew G, COA on 10/15/2020  9:50 AM. (History)       ALLERGIES No Known Allergies  PAST MEDICAL HISTORY Past Medical History:  Diagnosis Date  . Diabetes mellitus without complication (HCC)   . Diabetic retinopathy (HCC)    NPDR OU  . GERD (gastroesophageal reflux disease)   .  Hypertension   . Hypertensive retinopathy    OU   Past Surgical History:  Procedure Laterality Date  . APPENDECTOMY    . CATARACT EXTRACTION Bilateral   . COLONOSCOPY  2005  . EYE SURGERY Bilateral    Cat Sx    FAMILY HISTORY History reviewed. No pertinent family history.  SOCIAL HISTORY Social History   Tobacco Use  . Smoking status: Never Smoker  . Smokeless tobacco: Never Used  Substance Use Topics  . Alcohol use: No  . Drug use: No         OPHTHALMIC EXAM:  Base Eye Exam    Visual Acuity (Snellen - Linear)      Right Left   Dist [place] 20/60 20/30   Dist ph [place] 20/30 20/20 -       Tonometry (Tonopen, 9:53 AM)      Right Left    Pressure 15 16       Pupils      Dark Light Shape React APD   Right 3 2 Round Brisk None   Left 3 2 Round Brisk None       Visual Fields (Counting fingers)      Left Right    Full Full       Extraocular Movement      Right Left    Full, Ortho Full, Ortho       Neuro/Psych    Oriented x3: Yes   Mood/Affect: Normal       Dilation    Both eyes: 1.0% Mydriacyl, 2.5% Phenylephrine @ 9:53 AM        Slit Lamp and Fundus Exam    Slit Lamp Exam      Right Left   Lids/Lashes Dermato, mild ptosis Dermato, mild MD   Conjunctiva/Sclera White & quiet, mild inf conj. chalasis Mild melanosis, mild inf conj. chalasis   Cornea Mild arcus, 3+ inf PEE Mild arcuss, 2+ inf PEE   Anterior Chamber Deep & clear Deep and clear   Iris Round & dilated.  No NV. Round and dilated.  No NV.   Lens 3 pc IOL w/open PC; mild superior displacement 3 pc PCIOL in good position.  Open PC.   Vitreous Synerisis Synerisis       Fundus Exam      Right Left   Disc Mild pallor, sharp rim, compact, mild PPA Mild pallor, sharp rim, compact, mild PPA, CWS at 1030 about the disc   C/D Ratio 0.3 0.1   Macula Flat, blunted foveal reflex, rpe mottling and clumping, mild cystic changes, scattered MA/IRH superior macula Blunted foveal reflex, cluster of MA and IRH/SRH temporal to fovea -- improving  Scattered MA elsewhere.  +Edema temporal macula with focal exudate; central cystic changes improving   Vessels Attenuated, mild tortuosity Attenuated, mild tortuosity   Periphery Attached, mild reticular degeneration, scattered DBH Attached, Mild reticular degeneration, scattered DBH          IMAGING AND PROCEDURES  Imaging and Procedures for 10/15/2020  OCT, Retina - OU - Both Eyes       Right Eye Quality was good. Central Foveal Thickness: 280. Progression has been stable. Findings include normal foveal contour, no IRF, no SRF, intraretinal hyper-reflective material (Mild cystic changes and prominent IRHM  superior macula--persistent).   Left Eye Quality was good. Central Foveal Thickness: 300. Findings include abnormal foveal contour, intraretinal fluid, no SRF, intraretinal hyper-reflective material (Mild interval improvement in temporal IRF; mild interval increase in central IRF).  Notes *Images captured and stored on drive  Diagnosis / Impression:  DME OU (OS>OD) OD: Mild cystic changes and prominent IHRM superior macula--persistent OS: Mild interval improvement in temporal IRF; mild interval increase in central IRF  Clinical management:  See below  Abbreviations: NFP - Normal foveal profile. CME - cystoid macular edema. PED - pigment epithelial detachment. IRF - intraretinal fluid. SRF - subretinal fluid. EZ - ellipsoid zone. ERM - epiretinal membrane. ORA - outer retinal atrophy. ORT - outer retinal tubulation. SRHM - subretinal hyper-reflective material. IRHM - intraretinal hyper-reflective material        Intravitreal Injection, Pharmacologic Agent - OS - Left Eye       Time Out 10/15/2020. 11:05 AM. Confirmed correct patient, procedure, site, and patient consented.   Anesthesia Topical anesthesia was used. Anesthetic medications included Lidocaine 2%, Proparacaine 0.5%.   Procedure Preparation included eyelid speculum, 5% betadine to ocular surface. A (32g) needle was used.   Injection:  1.25 mg Bevacizumab (AVASTIN) 1.25mg /0.51mL SOLN   NDC: 70786-754-49, Lot: [id number], Expiration date: 10/30/2020   Route: Intravitreal, Site: Left Eye, Waste: 0.05 mL  Post-op Post injection exam found visual acuity of at least counting fingers. The patient tolerated the procedure well. There were no complications. The patient received written and verbal post procedure care education. Post injection medications were not given.                 ASSESSMENT/PLAN:    ICD-10-CM   1. Moderate nonproliferative diabetic retinopathy of both eyes with macular edema associated with type  2 diabetes mellitus (HCC)  Q19.7588 Intravitreal Injection, Pharmacologic Agent - OS - Left Eye    Bevacizumab (AVASTIN) SOLN 1.25 mg  2. Retinal edema  H35.81 OCT, Retina - OU - Both Eyes  3. Essential hypertension  I10   4. Hypertensive retinopathy of both eyes  H35.033   5. Pseudophakia of both eyes  Z96.1   6. Dry eyes  H04.123     1,2. Mod non-proliferative diabetic retinopathy OU - exam shows mild cystic changes OD; +DME temporal macula OS - s/p IVA OS #1 (09.08.21), #2 (10.06.21), #3 (11.3.21) - FA (07.09.21) shows mod NPDR OU (OS>OD); late leaking MA OU; No NV OU - OCT  OD: Mild cystic changes and prominent IHRM superior macula--persistent             OS: Mild interval improvement in temporal IRF; mild interval increase in central IRF - recommend IVA OS #4 today, 12.08.21 - pt wishes to proceed with injection  - RBA of procedure discussed, questions answered  - informed consent obtained, signed and scanned, 09.08.21 - see procedure note - suspect blurred vision OD more related to DES rather than diabetic retinopathy - f/u in 5 wk, sooner prn -- DFE/OCT  3,4. Hypertensive retinopathy OU - discussed importance of tight BP control - monitor  5. Pseudophakia OU  - s/p CE/IOL OU  - IOLs in good position, doing well - monitor  6. Dry eyes OU (OD>OS)  - with minimal DME OD, suspect majority of decreased vision OD is due to dry eyes             - recommend artificial tears QID OU and lubricating ointment as needed  Ophthalmic Meds Ordered this visit:  Meds ordered this encounter  Medications  . Bevacizumab (AVASTIN) SOLN 1.25 mg      Return in about 5 weeks (around 11/19/2020) for f/u NPDR OU, DFE, OCT.  There are no Patient Instructions on  file for this visit.  Explained the diagnoses, plan, and follow up with the patient and they expressed understanding.  Patient expressed understanding of the importance of proper follow up care.   This document serves as a record of  services personally performed by Karie Chimera, MD, PhD. It was created on their behalf by Cristopher Estimable, COT an ophthalmic technician. The creation of this record is the provider's dictation and/or activities during the visit.    Electronically signed by: Cristopher Estimable, COT 12.6.21 @ 10:32 PM   This document serves as a record of services personally performed by Karie Chimera, MD, PhD. It was created on their behalf by Glee Arvin. Manson Passey, OA an ophthalmic technician. The creation of this record is the provider's dictation and/or activities during the visit.    Electronically signed by: Glee Arvin. Manson Passey, OA  10:32 PM  Karie Chimera, M.D., Ph.D. Diseases & Surgery of the Retina and Vitreous [facility] 10/15/2020   I have reviewed the above documentation for accuracy and completeness, and I agree with the above. Karie Chimera, M.D., Ph.D. 10/16/20 10:32 PM   Abbreviations: M myopia (nearsighted); A astigmatism; H hyperopia (farsighted); P presbyopia; Mrx spectacle prescription;  CTL contact lenses; OD right eye; OS left eye; OU both eyes  XT exotropia; ET esotropia; PEK punctate epithelial keratitis; PEE punctate epithelial erosions; DES dry eye syndrome; MGD meibomian gland dysfunction; ATs artificial tears; PFAT's preservative free artificial tears; NSC nuclear sclerotic cataract; PSC posterior subcapsular cataract; ERM epi-retinal membrane; PVD posterior vitreous detachment; RD retinal detachment; DM diabetes mellitus; DR diabetic retinopathy; NPDR non-proliferative diabetic retinopathy; PDR proliferative diabetic retinopathy; CSME clinically significant macular edema; DME diabetic macular edema; dbh dot blot hemorrhages; CWS cotton wool spot; POAG primary open angle glaucoma; C/D cup-to-disc ratio; HVF humphrey visual field; GVF goldmann visual field; OCT optical coherence tomography; IOP intraocular pressure; BRVO Branch retinal vein occlusion; CRVO central retinal  vein occlusion; CRAO central retinal artery occlusion; BRAO branch retinal artery occlusion; RT retinal tear; SB scleral buckle; PPV pars plana vitrectomy; VH Vitreous hemorrhage; PRP panretinal laser photocoagulation; IVK intravitreal kenalog; VMT vitreomacular traction; MH Macular hole;  NVD neovascularization of the disc; NVE neovascularization elsewhere; AREDS age related eye disease study; ARMD age related macular degeneration; POAG primary open angle glaucoma; EBMD epithelial/anterior basement membrane dystrophy; ACIOL anterior chamber intraocular lens; IOL intraocular lens; PCIOL posterior chamber intraocular lens; Phaco/IOL phacoemulsification with intraocular lens placement; PRK photorefractive keratectomy; LASIK laser assisted in situ keratomileusis; HTN hypertension; DM diabetes mellitus; COPD chronic obstructive pulmonary disease

## 2020-10-15 ENCOUNTER — Ambulatory Visit (INDEPENDENT_AMBULATORY_CARE_PROVIDER_SITE_OTHER): Payer: Medicare Other | Admitting: Ophthalmology

## 2020-10-15 ENCOUNTER — Encounter (INDEPENDENT_AMBULATORY_CARE_PROVIDER_SITE_OTHER): Payer: Self-pay | Admitting: Ophthalmology

## 2020-10-15 ENCOUNTER — Other Ambulatory Visit: Payer: Self-pay

## 2020-10-15 DIAGNOSIS — E113313 Type 2 diabetes mellitus with moderate nonproliferative diabetic retinopathy with macular edema, bilateral: Secondary | ICD-10-CM | POA: Diagnosis not present

## 2020-10-15 DIAGNOSIS — H3581 Retinal edema: Secondary | ICD-10-CM

## 2020-10-15 DIAGNOSIS — H04123 Dry eye syndrome of bilateral lacrimal glands: Secondary | ICD-10-CM

## 2020-10-15 DIAGNOSIS — H35033 Hypertensive retinopathy, bilateral: Secondary | ICD-10-CM | POA: Diagnosis not present

## 2020-10-15 DIAGNOSIS — I10 Essential (primary) hypertension: Secondary | ICD-10-CM | POA: Diagnosis not present

## 2020-10-15 DIAGNOSIS — Z961 Presence of intraocular lens: Secondary | ICD-10-CM

## 2020-10-16 ENCOUNTER — Encounter (INDEPENDENT_AMBULATORY_CARE_PROVIDER_SITE_OTHER): Payer: Self-pay | Admitting: Ophthalmology

## 2020-10-16 DIAGNOSIS — E113313 Type 2 diabetes mellitus with moderate nonproliferative diabetic retinopathy with macular edema, bilateral: Secondary | ICD-10-CM

## 2020-10-16 MED ORDER — BEVACIZUMAB CHEMO INJECTION 1.25MG/0.05ML SYRINGE FOR KALEIDOSCOPE
1.2500 mg | INTRAVITREAL | Status: AC | PRN
  Administered 2020-10-16: 1.25 mg via INTRAVITREAL

## 2020-11-17 NOTE — Progress Notes (Shared)
[facility] - Clinic Note  11/19/2020     CHIEF COMPLAINT Patient presents for No chief complaint on file.   HISTORY OF PRESENT ILLNESS: Michaela Herrera is a 87 y.o. female who presents to the clinic today for:     Referring physician: [name], MD [address] [place],  [place] [address]  HISTORICAL INFORMATION:   Selected notes from the MEDICAL RECORD NUMBER Referred by Dr. [place] DeMarco for concern of DME   CURRENT MEDICATIONS: No current outpatient medications on file. (Ophthalmic Drugs)   No current facility-administered medications for this visit. (Ophthalmic Drugs)   Current Outpatient Medications (Other)  Medication Sig  . Alogliptin Benzoate (NESINA) 25 MG TABS Take 25 mg by mouth daily.  Marland Kitchen amLODipine (NORVASC) 5 MG tablet Take 5 mg by mouth 2 (two) times daily.  Marland Kitchen atorvastatin (LIPITOR) 10 MG tablet Take 10 mg by mouth 3 (three) times a week.  Marland Kitchen b complex vitamins tablet Take 1 tablet by mouth daily.  . Cholecalciferol (VITAMIN D PO) Take 1 tablet by mouth daily.  . cloNIDine (CATAPRES) 0.2 MG tablet Take 1 tablet (0.2 mg total) by mouth at bedtime.  . furosemide (LASIX) 20 MG tablet Take 1 tablet (20 mg total) by mouth every Monday, Wednesday, and Friday.  Marland Kitchen glipiZIDE (GLUCOTROL XL) 5 MG 24 hr tablet Take 1 tablet (5 mg total) by mouth daily with breakfast.  . insulin aspart (NOVOLOG) 100 UNIT/ML injection Inject 0-9 Units into the skin 3 (three) times daily with meals.  . isosorbide mononitrate (IMDUR) 60 MG 24 hr tablet Take 60 mg by mouth daily.  Marland Kitchen JANUVIA 100 MG tablet Take 100 mg by mouth daily.  Marland Kitchen losartan (COZAAR) 100 MG tablet Take 100 mg by mouth daily.  . metFORMIN (GLUCOPHAGE) 500 MG tablet Take 1,000 mg by mouth 2 (two) times daily with a meal.  . metoprolol (LOPRESSOR) 50 MG tablet Take 50 mg by mouth 2 (two) times daily.  . potassium chloride (K-DUR,KLOR-CON) 10 MEQ tablet Take 10 mEq by mouth daily.  . potassium  chloride (KLOR-CON) 10 MEQ tablet Take 10 mEq by mouth daily.   No current facility-administered medications for this visit. (Other)      REVIEW OF SYSTEMS:    ALLERGIES No Known Allergies  PAST MEDICAL HISTORY Past Medical History:  Diagnosis Date  . Diabetes mellitus without complication (HCC)   . Diabetic retinopathy (HCC)    NPDR OU  . GERD (gastroesophageal reflux disease)   . Hypertension   . Hypertensive retinopathy    OU   Past Surgical History:  Procedure Laterality Date  . APPENDECTOMY    . CATARACT EXTRACTION Bilateral   . COLONOSCOPY  2005  . EYE SURGERY Bilateral    Cat Sx    FAMILY HISTORY No family history on file.  SOCIAL HISTORY Social History   Tobacco Use  . Smoking status: Never Smoker  . Smokeless tobacco: Never Used  Substance Use Topics  . Alcohol use: No  . Drug use: No         OPHTHALMIC EXAM:  Not recorded     IMAGING AND PROCEDURES  Imaging and Procedures for 11/19/2020           ASSESSMENT/PLAN:  No diagnosis found.  1,2. Mod non-proliferative diabetic retinopathy OU - exam shows mild cystic changes OD; +DME temporal macula OS - s/p IVA OS #1 (09.08.21), #2 (10.06.21), #3 (11.3.21), #4 (12.08.21) - FA (07.09.21) shows mod NPDR OU (  OS>OD); late leaking MA OU; No NV OU - OCT  OD: Mild cystic changes and prominent IHRM superior macula--persistent             OS: Mild interval improvement in temporal IRF; mild interval increase in central IRF - recommend IVA OS #5 today, 01.12.22 - pt wishes to proceed with injection  - RBA of procedure discussed, questions answered  - informed consent obtained, signed and scanned, 09.08.21 - see procedure note - suspect blurred vision OD more related to DES rather than diabetic retinopathy - f/u in 5 wk, sooner prn -- DFE/OCT  3,4. Hypertensive retinopathy OU - discussed importance of tight BP control - monitor  5. Pseudophakia OU  - s/p CE/IOL OU  - IOLs in good  position, doing well - monitor  6. Dry eyes OU (OD>OS)  - with minimal DME OD, suspect majority of decreased vision OD is due to dry eyes             - recommend artificial tears QID OU and lubricating ointment as needed  Ophthalmic Meds Ordered this visit:  No orders of the defined types were placed in this encounter.     No follow-ups on file.  There are no Patient Instructions on file for this visit.  Explained the diagnoses, plan, and follow up with the patient and they expressed understanding.  Patient expressed understanding of the importance of proper follow up care.   This document serves as a record of services personally performed by Karie Chimera, MD, PhD. It was created on their behalf by Annalee Genta, COMT. The creation of this record is the provider's dictation and/or activities during the visit.  Electronically signed by: Annalee Genta, COMT 11/17/20 10:59 AM    Karie Chimera, M.D., Ph.D. Diseases & Surgery of the Retina and Vitreous [facility] 10/15/2020     Abbreviations: M myopia (nearsighted); A astigmatism; H hyperopia (farsighted); P presbyopia; Mrx spectacle prescription;  CTL contact lenses; OD right eye; OS left eye; OU both eyes  XT exotropia; ET esotropia; PEK punctate epithelial keratitis; PEE punctate epithelial erosions; DES dry eye syndrome; MGD meibomian gland dysfunction; ATs artificial tears; PFAT's preservative free artificial tears; NSC nuclear sclerotic cataract; PSC posterior subcapsular cataract; ERM epi-retinal membrane; PVD posterior vitreous detachment; RD retinal detachment; DM diabetes mellitus; DR diabetic retinopathy; NPDR non-proliferative diabetic retinopathy; PDR proliferative diabetic retinopathy; CSME clinically significant macular edema; DME diabetic macular edema; dbh dot blot hemorrhages; CWS cotton wool spot; POAG primary open angle glaucoma; C/D cup-to-disc ratio; HVF humphrey visual field; GVF goldmann  visual field; OCT optical coherence tomography; IOP intraocular pressure; BRVO Branch retinal vein occlusion; CRVO central retinal vein occlusion; CRAO central retinal artery occlusion; BRAO branch retinal artery occlusion; RT retinal tear; SB scleral buckle; PPV pars plana vitrectomy; VH Vitreous hemorrhage; PRP panretinal laser photocoagulation; IVK intravitreal kenalog; VMT vitreomacular traction; MH Macular hole;  NVD neovascularization of the disc; NVE neovascularization elsewhere; AREDS age related eye disease study; ARMD age related macular degeneration; POAG primary open angle glaucoma; EBMD epithelial/anterior basement membrane dystrophy; ACIOL anterior chamber intraocular lens; IOL intraocular lens; PCIOL posterior chamber intraocular lens; Phaco/IOL phacoemulsification with intraocular lens placement; PRK photorefractive keratectomy; LASIK laser assisted in situ keratomileusis; HTN hypertension; DM diabetes mellitus; COPD chronic obstructive pulmonary disease

## 2020-11-19 ENCOUNTER — Encounter (INDEPENDENT_AMBULATORY_CARE_PROVIDER_SITE_OTHER): Payer: Self-pay

## 2020-11-19 ENCOUNTER — Encounter (INDEPENDENT_AMBULATORY_CARE_PROVIDER_SITE_OTHER): Payer: Medicare Other | Admitting: Ophthalmology

## 2021-04-09 ENCOUNTER — Telehealth (INDEPENDENT_AMBULATORY_CARE_PROVIDER_SITE_OTHER): Payer: Self-pay

## 2022-06-27 ENCOUNTER — Emergency Department (HOSPITAL_BASED_OUTPATIENT_CLINIC_OR_DEPARTMENT_OTHER): Payer: Medicare HMO | Admitting: Radiology

## 2022-06-27 ENCOUNTER — Observation Stay (HOSPITAL_BASED_OUTPATIENT_CLINIC_OR_DEPARTMENT_OTHER)
Admission: EM | Admit: 2022-06-27 | Discharge: 2022-06-29 | Disposition: A | Discharge status: Home Health | Payer: Medicare HMO | Attending: Internal Medicine | Admitting: Internal Medicine

## 2022-06-27 ENCOUNTER — Encounter (HOSPITAL_BASED_OUTPATIENT_CLINIC_OR_DEPARTMENT_OTHER): Payer: Self-pay

## 2022-06-27 ENCOUNTER — Emergency Department (HOSPITAL_BASED_OUTPATIENT_CLINIC_OR_DEPARTMENT_OTHER): Payer: Medicare HMO

## 2022-06-27 ENCOUNTER — Encounter (HOSPITAL_COMMUNITY): Payer: Self-pay

## 2022-06-27 ENCOUNTER — Other Ambulatory Visit: Payer: Self-pay

## 2022-06-27 DIAGNOSIS — Z79899 Other long term (current) drug therapy: Secondary | ICD-10-CM | POA: Insufficient documentation

## 2022-06-27 DIAGNOSIS — S2239XA Fracture of one rib, unspecified side, initial encounter for closed fracture: Secondary | ICD-10-CM | POA: Diagnosis present

## 2022-06-27 DIAGNOSIS — W19XXXA Unspecified fall, initial encounter: Secondary | ICD-10-CM

## 2022-06-27 DIAGNOSIS — Z7984 Long term (current) use of oral hypoglycemic drugs: Secondary | ICD-10-CM | POA: Insufficient documentation

## 2022-06-27 DIAGNOSIS — E11319 Type 2 diabetes mellitus with unspecified diabetic retinopathy without macular edema: Secondary | ICD-10-CM | POA: Diagnosis not present

## 2022-06-27 DIAGNOSIS — Y92002 Bathroom of unspecified non-institutional (private) residence single-family (private) house as the place of occurrence of the external cause: Secondary | ICD-10-CM | POA: Insufficient documentation

## 2022-06-27 DIAGNOSIS — I10 Essential (primary) hypertension: Secondary | ICD-10-CM | POA: Diagnosis present

## 2022-06-27 DIAGNOSIS — S2241XA Multiple fractures of ribs, right side, initial encounter for closed fracture: Secondary | ICD-10-CM | POA: Diagnosis not present

## 2022-06-27 DIAGNOSIS — E860 Dehydration: Secondary | ICD-10-CM | POA: Insufficient documentation

## 2022-06-27 DIAGNOSIS — W1811XA Fall from or off toilet without subsequent striking against object, initial encounter: Secondary | ICD-10-CM | POA: Diagnosis not present

## 2022-06-27 DIAGNOSIS — E1165 Type 2 diabetes mellitus with hyperglycemia: Secondary | ICD-10-CM | POA: Diagnosis not present

## 2022-06-27 DIAGNOSIS — Z794 Long term (current) use of insulin: Secondary | ICD-10-CM | POA: Diagnosis not present

## 2022-06-27 DIAGNOSIS — R0789 Other chest pain: Secondary | ICD-10-CM | POA: Diagnosis present

## 2022-06-27 DIAGNOSIS — M1711 Unilateral primary osteoarthritis, right knee: Secondary | ICD-10-CM | POA: Diagnosis not present

## 2022-06-27 DIAGNOSIS — R55 Syncope and collapse: Secondary | ICD-10-CM | POA: Diagnosis present

## 2022-06-27 DIAGNOSIS — I119 Hypertensive heart disease without heart failure: Secondary | ICD-10-CM | POA: Diagnosis not present

## 2022-06-27 DIAGNOSIS — Z7982 Long term (current) use of aspirin: Secondary | ICD-10-CM | POA: Insufficient documentation

## 2022-06-27 DIAGNOSIS — E0865 Diabetes mellitus due to underlying condition with hyperglycemia: Secondary | ICD-10-CM | POA: Diagnosis present

## 2022-06-27 DIAGNOSIS — Y92009 Unspecified place in unspecified non-institutional (private) residence as the place of occurrence of the external cause: Secondary | ICD-10-CM

## 2022-06-27 LAB — COMPREHENSIVE METABOLIC PANEL
ALT: 10 U/L (ref 0–44)
AST: 15 U/L (ref 15–41)
Albumin: 4.8 g/dL (ref 3.5–5.0)
Alkaline Phosphatase: 142 U/L — ABNORMAL HIGH (ref 38–126)
Anion gap: 8 (ref 5–15)
BUN: 27 mg/dL — ABNORMAL HIGH (ref 8–23)
CO2: 26 mmol/L (ref 22–32)
Calcium: 13.5 mg/dL (ref 8.9–10.3)
Chloride: 102 mmol/L (ref 98–111)
Creatinine, Ser: 1.35 mg/dL — ABNORMAL HIGH (ref 0.44–1.00)
GFR, Estimated: 37 mL/min — ABNORMAL LOW (ref >60)
Glucose, Bld: 113 mg/dL — ABNORMAL HIGH (ref 70–99)
Potassium: 4.3 mmol/L (ref 3.5–5.1)
Sodium: 136 mmol/L (ref 135–145)
Total Bilirubin: 0.6 mg/dL (ref 0.3–1.2)
Total Protein: 8.1 g/dL (ref 6.5–8.1)

## 2022-06-27 LAB — URINALYSIS, ROUTINE W REFLEX MICROSCOPIC
Bilirubin Urine: NEGATIVE
Glucose, UA: >1000 mg/dL — AB
Hgb urine dipstick: NEGATIVE
Ketones, ur: NEGATIVE mg/dL
Leukocytes,Ua: NEGATIVE
Nitrite: NEGATIVE
Protein, ur: NEGATIVE mg/dL
Specific Gravity, Urine: 1.005 (ref 1.005–1.030)
pH: 5 (ref 5.0–8.0)

## 2022-06-27 LAB — AMMONIA: Ammonia: 33 umol/L (ref 9–35)

## 2022-06-27 LAB — CBC WITH DIFFERENTIAL/PLATELET
Abs Immature Granulocytes: 0.03 10*3/uL (ref 0.00–0.07)
Basophils Absolute: 0.1 10*3/uL (ref 0.0–0.1)
Basophils Relative: 0 %
Eosinophils Absolute: 0.3 10*3/uL (ref 0.0–0.5)
Eosinophils Relative: 2 %
HCT: 44.4 % (ref 36.0–46.0)
Hemoglobin: 14.6 g/dL (ref 12.0–15.0)
Immature Granulocytes: 0 %
Lymphocytes Relative: 21 %
Lymphs Abs: 2.9 10*3/uL (ref 0.7–4.0)
MCH: 28.8 pg (ref 26.0–34.0)
MCHC: 32.9 g/dL (ref 30.0–36.0)
MCV: 87.6 fL (ref 80.0–100.0)
Monocytes Absolute: 0.9 10*3/uL (ref 0.1–1.0)
Monocytes Relative: 6 %
Neutro Abs: 9.8 10*3/uL — ABNORMAL HIGH (ref 1.7–7.7)
Neutrophils Relative %: 71 %
Platelets: 300 10*3/uL (ref 150–400)
RBC: 5.07 MIL/uL (ref 3.87–5.11)
RDW: 13.6 % (ref 11.5–15.5)
WBC: 13.9 10*3/uL — ABNORMAL HIGH (ref 4.0–10.5)
nRBC: 0 % (ref 0.0–0.2)

## 2022-06-27 LAB — MAGNESIUM: Magnesium: 1.9 mg/dL (ref 1.7–2.4)

## 2022-06-27 LAB — PHOSPHORUS: Phosphorus: 2.4 mg/dL — ABNORMAL LOW (ref 2.5–4.6)

## 2022-06-27 MED ORDER — LACTATED RINGERS IV BOLUS
1000.0000 mL | Freq: Once | INTRAVENOUS | Status: AC
  Administered 2022-06-27: 1000 mL via INTRAVENOUS

## 2022-06-27 MED ORDER — IOHEXOL 300 MG/ML  SOLN
100.0000 mL | Freq: Once | INTRAMUSCULAR | Status: AC | PRN
  Administered 2022-06-27: 50 mL via INTRAVENOUS

## 2022-06-27 MED ORDER — ACETAMINOPHEN 500 MG PO TABS
1000.0000 mg | ORAL_TABLET | Freq: Once | ORAL | Status: AC
  Administered 2022-06-27: 1000 mg via ORAL
  Filled 2022-06-27: qty 2

## 2022-06-27 MED ORDER — OXYCODONE HCL 5 MG PO TABS
5.0000 mg | ORAL_TABLET | Freq: Once | ORAL | Status: AC
  Administered 2022-06-27: 5 mg via ORAL
  Filled 2022-06-27: qty 1

## 2022-06-27 NOTE — ED Provider Notes (Addendum)
[facility] EMERGENCY DEPT Provider Note   CSN: [id number] Arrival date & time: 06/27/22  1507     History  Chief Complaint  Patient presents with   Marletta Lor    Michaela Herrera is a 89 y.o. female.   Fall     88 year old female with medical history significant for HTN, DM 2, GERD, diabetic retinopathy, presenting to the emergency department after a fall on Friday.  The patient presents with her daughter who assists in providing HPI.  She states that she found her hanging off the toilet on Friday night.  She called out for help.  She states that she tried to get up but her right knee gave out on her which caused the fall.  She landed on her right chest/ribs and has been having some rib pain ever since.  She denies any head trauma, loss of consciousness or neck trauma.  She is at her baseline mental status per her daughter.  She denies any other pain or complaints.  She arrived to the emergency department GCS 15, ABC intact.  Home Medications Prior to Admission medications   Medication Sig Start Date End Date Taking? Authorizing Provider  Alogliptin Benzoate (NESINA) 25 MG TABS Take 25 mg by mouth daily.    [provider]  amLODipine (NORVASC) 5 MG tablet Take 5 mg by mouth 2 (two) times daily. 12/11/15   [provider]  atorvastatin (LIPITOR) 10 MG tablet Take 10 mg by mouth 3 (three) times a week. 07/07/20   [provider]  b complex vitamins tablet Take 1 tablet by mouth daily.    [provider]  Cholecalciferol (VITAMIN D PO) Take 1 tablet by mouth daily.    [provider]  cloNIDine (CATAPRES) 0.2 MG tablet Take 1 tablet (0.2 mg total) by mouth at bedtime. 01/23/16   [name], MD  furosemide (LASIX) 20 MG tablet Take 1 tablet (20 mg total) by mouth every Monday, Wednesday, and Friday. 01/23/16   [name], MD  glipiZIDE (GLUCOTROL XL) 5 MG 24 hr tablet Take 1 tablet (5 mg total) by mouth daily with breakfast. 01/23/16    [name], MD  insulin aspart (NOVOLOG) 100 UNIT/ML injection Inject 0-9 Units into the skin 3 (three) times daily with meals. 12/17/15   [name], MD  isosorbide mononitrate (IMDUR) 60 MG 24 hr tablet Take 60 mg by mouth daily. 12/04/15   [provider]  JANUVIA 100 MG tablet Take 100 mg by mouth daily. 10/07/20   [provider]  losartan (COZAAR) 100 MG tablet Take 100 mg by mouth daily. 12/11/15   [provider]  metFORMIN (GLUCOPHAGE) 500 MG tablet Take 1,000 mg by mouth 2 (two) times daily with a meal.    [provider]  metoprolol (LOPRESSOR) 50 MG tablet Take 50 mg by mouth 2 (two) times daily. 12/04/15   [provider]  potassium chloride (K-DUR,KLOR-CON) 10 MEQ tablet Take 10 mEq by mouth daily.    [provider]  potassium chloride (KLOR-CON) 10 MEQ tablet Take 10 mEq by mouth daily. 09/30/20   [provider]      Allergies    Patient has no known allergies.    Review of Systems   Review of Systems  All other systems reviewed and are negative.   Physical Exam Updated Vital Signs BP (!) 160/78   Pulse 71   Temp 98.3 F (36.8 C)   Resp (!) 22   Ht 5\' 4"  (  1.626 m)   Wt 63 kg   SpO2 97%   BMI 23.86 kg/m  Physical Exam Vitals and nursing note reviewed.  Constitutional:      General: She is not in acute distress.    Appearance: She is well-developed.     Comments: GCS 15, ABC intact  HENT:     Head: Normocephalic and atraumatic.  Eyes:     Extraocular Movements: Extraocular movements intact.     Conjunctiva/sclera: Conjunctivae normal.     Pupils: Pupils are equal, round, and reactive to light.  Neck:     Comments: No midline tenderness to palpation of the cervical spine.  Range of motion intact Cardiovascular:     Rate and Rhythm: Normal rate and regular rhythm.  Pulmonary:     Effort: Pulmonary effort is normal. No respiratory distress.     Breath sounds: Normal breath sounds.  Chest:      Comments: Clavicles stable nontender to AP compression.  Chest wall stable.  Tenderness to palpation of the right chest wall Abdominal:     Palpations: Abdomen is soft.     Tenderness: There is no abdominal tenderness.  Musculoskeletal:     Cervical back: Neck supple.     Comments: No midline tenderness to palpation of the thoracic or lumbar spine.  Extremities atraumatic with intact range of motion  Skin:    General: Skin is warm and dry.  Neurological:     Mental Status: She is alert.     Comments: Cranial nerves II through XII grossly intact.  Moving all 4 extremities spontaneously.  Sensation grossly intact all 4 extremities     ED Results / Procedures / Treatments   Labs (all labs ordered are listed, but only abnormal results are displayed) Labs Reviewed  CBC WITH DIFFERENTIAL/PLATELET - Abnormal; Notable for the following components:      Result Value   WBC 13.9 (*)    Neutro Abs 9.8 (*)    All other components within normal limits  COMPREHENSIVE METABOLIC PANEL - Abnormal; Notable for the following components:   Glucose, Bld 113 (*)    BUN 27 (*)    Creatinine, Ser 1.35 (*)    Calcium 13.5 (*)    Alkaline Phosphatase 142 (*)    GFR, Estimated 37 (*)    All other components within normal limits  URINALYSIS, ROUTINE W REFLEX MICROSCOPIC - Abnormal; Notable for the following components:   Color, Urine COLORLESS (*)    Glucose, UA >1,000 (*)    All other components within normal limits  VITAMIN D 25 HYDROXY (VIT D DEFICIENCY, FRACTURES)  MAGNESIUM  CALCIUM, IONIZED  AMMONIA  PHOSPHORUS  PARATHYROID HORMONE, INTACT (NO CA)    EKG EKG Interpretation  Date/Time:  Sunday June 27 2022 16:35:26 EDT Ventricular Rate:  70 PR Interval:  165 QRS Duration: 89 QT Interval:  352 QTC Calculation: 380 R Axis:   -40 Text Interpretation: Sinus rhythm Left anterior fascicular block Abnormal R-wave progression, early transition Left ventricular hypertrophy Confirmed by  Ernie Avena ([id number]) on 06/27/2022 4:45:00 PM  Radiology CT Chest W Contrast  Result Date: 06/27/2022 CLINICAL DATA:  Fall, right-sided chest trauma EXAM: CT CHEST WITH CONTRAST TECHNIQUE: Multidetector CT imaging of the chest was performed during intravenous contrast administration. RADIATION DOSE REDUCTION: This exam was performed according to the departmental dose-optimization program which includes automated exposure control, adjustment of the mA and/or kV according to patient size and/or use of iterative reconstruction technique. CONTRAST:  21mL OMNIPAQUE IOHEXOL  300 MG/ML  SOLN COMPARISON:  None Available. FINDINGS: Cardiovascular: Aortic atherosclerosis. Cardiomegaly. Three-vessel coronary artery calcifications. Enlargement of the main pulmonary artery measuring up to 3.9 cm in caliber. No pericardial effusion. Mediastinum/Nodes: No enlarged mediastinal, hilar, or axillary lymph nodes. Small hiatal hernia. Thyroid gland, trachea, and esophagus demonstrate no significant findings. Lungs/Pleura: Elevation of the right hemidiaphragm, possibly related to splinting. Scarring and or atelectasis of the right lung base. No pleural effusion or pneumothorax. Upper Abdomen: No acute abnormality. Musculoskeletal: Mildly displaced fractures of the lateral right sixth, seventh, and eighth ribs (series 2, image 52 62, 72). IMPRESSION: 1. Mildly displaced fractures of the lateral right sixth, seventh, and eighth ribs. 2. No associated pneumothorax or pleural effusion. 3. Elevation of the right hemidiaphragm, possibly related to splinting. 4. Cardiomegaly and coronary artery disease. 5. Enlargement of the main pulmonary artery, as can be seen in pulmonary hypertension. Aortic Atherosclerosis (ICD10-I70.0). Electronically Signed   By: Jearld Lesch M.D.   On: 06/27/2022 19:18   DG Knee Complete 4 Views Right  Result Date: 06/27/2022 CLINICAL DATA:  Fall from toilet, right knee pain EXAM: RIGHT KNEE - COMPLETE 4+ VIEW  COMPARISON:  None Available. FINDINGS: No evidence of fracture, dislocation, or joint effusion. Moderate tricompartmental joint space narrowing and osteophytosis. Vascular calcinosis. IMPRESSION: No fracture or dislocation of the right knee. Moderate tricompartmental osteoarthritis. Electronically Signed   By: Jearld Lesch M.D.   On: 06/27/2022 16:28   DG Ribs Unilateral W/Chest Right  Result Date: 06/27/2022 CLINICAL DATA:  Fall, right rib pain EXAM: RIGHT RIBS AND CHEST - 3+ VIEW COMPARISON:  06/30/2016 FINDINGS: Mildly displaced fractures of the lateral right sixth through eighth ribs. There is no evidence of pneumothorax. Probable trace bilateral pleural effusions. Both lungs are clear. Cardiomegaly. IMPRESSION: 1. Mildly displaced fractures of the lateral right sixth through eighth ribs. No pneumothorax. 2. Probable trace bilateral pleural effusions. 3. Cardiomegaly. Electronically Signed   By: Jearld Lesch M.D.   On: 06/27/2022 16:27    Procedures Procedures    Medications Ordered in ED Medications  lactated ringers bolus 1,000 mL (has no administration in time range)  acetaminophen (TYLENOL) tablet 1,000 mg (1,000 mg Oral Given 06/27/22 1733)  oxyCODONE (Oxy IR/ROXICODONE) immediate release tablet 5 mg (5 mg Oral Given 06/27/22 1733)  iohexol (OMNIPAQUE) 300 MG/ML solution 100 mL (50 mLs Intravenous Contrast Given 06/27/22 1845)    ED Course/ Medical Decision Making/ A&P Clinical Course as of 06/27/22 1943  Sun Jun 27, 2022  1837 Calcium(!!): 13.5 [JL]    Clinical Course User Index [JL] Ernie Avena, MD                           Medical Decision Making Amount and/or Complexity of Data Reviewed Labs: ordered. Decision-making details documented in ED Course. Radiology: ordered.  Risk OTC drugs. Prescription drug management. Decision regarding hospitalization.   88 year old female with medical history significant for HTN, DM 2, GERD, diabetic retinopathy, presenting to the  emergency department after a fall on Friday.  The patient presents with her daughter who assists in providing HPI.  She states that she found her hanging off the toilet on Friday night.  She called out for help.  She states that she tried to get up but her right knee gave out on her which caused the fall.  She landed on her right chest/ribs and has been having some rib pain ever since.  She denies any head trauma,  loss of consciousness or neck trauma.  She is at her baseline mental status per her daughter.  She denies any other pain or complaints.  She arrived to the emergency department GCS 15, ABC intact.  Currently, she is awake, alert, and protecting her own airway and is hemodynamically stable.  Trauma imaging revealed (full reports in EMR): Portable CXR w/ribs right:  No evidence of pneumothorax or tracheal deviation, evidence of 3 rib fractures sixth, seventh and eighth ribs on the right. XR right knee: Osteoarthritis of the right knee without fracture or dislocation CT of the chest with contrast: IMPRESSION:  1. Mildly displaced fractures of the lateral right sixth, seventh,  and eighth ribs.  2. No associated pneumothorax or pleural effusion.  3. Elevation of the right hemidiaphragm, possibly related to  splinting.  4. Cardiomegaly and coronary artery disease.  5. Enlargement of the main pulmonary artery, as can be seen in  pulmonary hypertension.    Aortic Atherosclerosis (ICD10-I70.0).    There were  significant lab abnormalities.  The patient had mild nonspecific leukocytosis to 13.9.  Her CMP revealed worsening renal function with a creatinine of 1.35 from baseline of around 1 with additional concern for hypercalcemia to 13.5.  This could explain the patient's intermittent confusion and change in her mental status.  Urinalysis was negative for UTI.  Additional labs include ammonia, magnesium, PTH, phosphorus, ionized calcium and vitamin D were ordered.  Given the patient's 3 rib  fractures, trauma surgery was consulted for monitoring and recommendations.  Dr. Sheliah Hatch will evaluate the patient once admission to the medicine service is complete given the patient's hypercalcemia.  Patient was administered an IV fluid bolus in addition to oral Tylenol and oxycodone for pain control.  No evidence of pneumothorax or pneumonia and the patient is currently saturating well on room air with no evidence of flare chest on exam.  Will be for medicine admission given the patient's electrolyte derangements, IV fluid resuscitation.  Most recent EF on echocardiogram was normal.  We will administer a 1 L IV fluid bolus.  Hospitalist medicine consulted for admission. Dr. Imogene Burn accepted the patient in admission.   Final Clinical Impression(s) / ED Diagnoses Final diagnoses:  Primary osteoarthritis of right knee  Closed fracture of multiple ribs of right side, initial encounter  Hypercalcemia    Rx / DC Orders ED Discharge Orders     None         Ernie Avena, MD 06/27/22 1945    Ernie Avena, MD 06/27/22 2009

## 2022-06-27 NOTE — Progress Notes (Signed)
  [facility] will assume care on arrival to accepting facility. Until arrival, care as per EDP. However, [facility] available 24/7 for questions and assistance.   Nursing staff please page [facility] Admits and Consults ([phone]) as soon as the patient arrives to the hospital.  Nezzie Manera, DO [facility]  

## 2022-06-27 NOTE — Discharge Instructions (Signed)
You have rib fractures of the sixth, seventh and eighth ribs on the right.  Your knee x-ray revealed osteoarthritis.  Follow with Primary MD [name], MD in 7 days, use incentive spirometer every 1 hour while awake, sit up in chair as much as possible.  Take the incentive spirometer with you home.  Get CBC, CMP, 2 view Chest X ray -  checked next visit within 1 week by Primary MD    Activity: As tolerated with Full fall precautions use walker/cane & assistance as needed  Disposition Home    Diet: Heart Healthy Low Carb, check CBGs  Special Instructions: If you have smoked or chewed Tobacco  in the last 2 yrs please stop smoking, stop any regular Alcohol  and or any Recreational drug use.  On your next visit with your primary care physician please Get Medicines reviewed and adjusted.  Please request your Prim.MD to go over all Hospital Tests and Procedure/Radiological results at the follow up, please get all Hospital records sent to your Prim MD by signing hospital release before you go home.  If you experience worsening of your admission symptoms, develop shortness of breath, life threatening emergency, suicidal or homicidal thoughts you must seek medical attention immediately by calling 911 or calling your MD immediately  if symptoms less severe.  You Must read complete instructions/literature along with all the possible adverse reactions/side effects for all the Medicines you take and that have been prescribed to you. Take any new Medicines after you have completely understood and accpet all the possible adverse reactions/side effects.

## 2022-06-27 NOTE — ED Notes (Signed)
CRITICAL VALUE STICKER  CRITICAL VALUE:Ca+ 13.5  RECEIVER (on-site recipient of call):Carmon Ginsberg, RN  DATE & TIME NOTIFIED:   MESSENGER (representative from lab):  MD NOTIFIED: Dr. Karene Fry  TIME OF NOTIFICATION:1835  RESPONSE:

## 2022-06-27 NOTE — ED Triage Notes (Signed)
Pt fell off of toilet on Friday night. Pt complains of R knee pain and R rib pain. Pt denies thinners. No LOC. Did not hit head.

## 2022-06-28 DIAGNOSIS — I10 Essential (primary) hypertension: Secondary | ICD-10-CM | POA: Diagnosis not present

## 2022-06-28 DIAGNOSIS — S2239XA Fracture of one rib, unspecified side, initial encounter for closed fracture: Secondary | ICD-10-CM | POA: Diagnosis present

## 2022-06-28 DIAGNOSIS — R0789 Other chest pain: Secondary | ICD-10-CM | POA: Diagnosis present

## 2022-06-28 DIAGNOSIS — E1165 Type 2 diabetes mellitus with hyperglycemia: Secondary | ICD-10-CM | POA: Diagnosis not present

## 2022-06-28 DIAGNOSIS — Z794 Long term (current) use of insulin: Secondary | ICD-10-CM | POA: Diagnosis not present

## 2022-06-28 DIAGNOSIS — Z7982 Long term (current) use of aspirin: Secondary | ICD-10-CM | POA: Diagnosis not present

## 2022-06-28 DIAGNOSIS — M1711 Unilateral primary osteoarthritis, right knee: Secondary | ICD-10-CM | POA: Diagnosis not present

## 2022-06-28 DIAGNOSIS — E860 Dehydration: Secondary | ICD-10-CM | POA: Diagnosis not present

## 2022-06-28 DIAGNOSIS — S2241XA Multiple fractures of ribs, right side, initial encounter for closed fracture: Secondary | ICD-10-CM | POA: Diagnosis not present

## 2022-06-28 DIAGNOSIS — E11319 Type 2 diabetes mellitus with unspecified diabetic retinopathy without macular edema: Secondary | ICD-10-CM | POA: Diagnosis not present

## 2022-06-28 DIAGNOSIS — Y92002 Bathroom of unspecified non-institutional (private) residence single-family (private) house as the place of occurrence of the external cause: Secondary | ICD-10-CM | POA: Diagnosis not present

## 2022-06-28 DIAGNOSIS — W19XXXA Unspecified fall, initial encounter: Secondary | ICD-10-CM

## 2022-06-28 DIAGNOSIS — Z7984 Long term (current) use of oral hypoglycemic drugs: Secondary | ICD-10-CM | POA: Diagnosis not present

## 2022-06-28 DIAGNOSIS — Z79899 Other long term (current) drug therapy: Secondary | ICD-10-CM | POA: Diagnosis not present

## 2022-06-28 DIAGNOSIS — W1811XA Fall from or off toilet without subsequent striking against object, initial encounter: Secondary | ICD-10-CM | POA: Diagnosis not present

## 2022-06-28 DIAGNOSIS — Y92009 Unspecified place in unspecified non-institutional (private) residence as the place of occurrence of the external cause: Secondary | ICD-10-CM

## 2022-06-28 LAB — CREATININE, SERUM
Creatinine, Ser: 1.14 mg/dL — ABNORMAL HIGH (ref 0.44–1.00)
GFR, Estimated: 45 mL/min — ABNORMAL LOW (ref >60)

## 2022-06-28 LAB — VITAMIN D 25 HYDROXY (VIT D DEFICIENCY, FRACTURES): Vit D, 25-Hydroxy: 42.05 ng/mL (ref 30–100)

## 2022-06-28 LAB — CBC
HCT: 41.5 % (ref 36.0–46.0)
Hemoglobin: 13.6 g/dL (ref 12.0–15.0)
MCH: 28.4 pg (ref 26.0–34.0)
MCHC: 32.8 g/dL (ref 30.0–36.0)
MCV: 86.6 fL (ref 80.0–100.0)
Platelets: 262 10*3/uL (ref 150–400)
RBC: 4.79 MIL/uL (ref 3.87–5.11)
RDW: 13.5 % (ref 11.5–15.5)
WBC: 15.7 10*3/uL — ABNORMAL HIGH (ref 4.0–10.5)
nRBC: 0 % (ref 0.0–0.2)

## 2022-06-28 LAB — TSH: TSH: 2.43 u[IU]/mL (ref 0.350–4.500)

## 2022-06-28 LAB — HEMOGLOBIN A1C
Hgb A1c MFr Bld: 6.6 % — ABNORMAL HIGH (ref 4.8–5.6)
Mean Plasma Glucose: 142.72 mg/dL

## 2022-06-28 LAB — GLUCOSE, CAPILLARY
Glucose-Capillary: 155 mg/dL — ABNORMAL HIGH (ref 70–99)
Glucose-Capillary: 154 mg/dL — ABNORMAL HIGH (ref 70–99)

## 2022-06-28 MED ORDER — CALCITONIN (SALMON) 200 UNIT/ACT NA SOLN
1.0000 | Freq: Every day | NASAL | Status: DC
Start: 2022-06-28 — End: 2022-06-29
  Administered 2022-06-28 – 2022-06-29 (×2): 1 via NASAL
  Filled 2022-06-28 (×2): qty 3.7

## 2022-06-28 MED ORDER — BISACODYL 5 MG PO TBEC: 5.0000 mg | DELAYED_RELEASE_TABLET | Freq: Every day | ORAL | Status: DC | PRN

## 2022-06-28 MED ORDER — ONDANSETRON HCL 4 MG PO TABS: 4.0000 mg | ORAL_TABLET | Freq: Four times a day (QID) | ORAL | Status: DC | PRN

## 2022-06-28 MED ORDER — SODIUM CHLORIDE 0.9 % IV SOLN: INTRAVENOUS | Status: DC

## 2022-06-28 MED ORDER — METFORMIN HCL 500 MG PO TABS: 1000.0000 mg | ORAL_TABLET | Freq: Two times a day (BID) | ORAL | Status: DC

## 2022-06-28 MED ORDER — LACTATED RINGERS IV BOLUS
500.0000 mL | Freq: Once | INTRAVENOUS | Status: AC
  Administered 2022-06-28: 500 mL via INTRAVENOUS

## 2022-06-28 MED ORDER — MORPHINE SULFATE (PF) 2 MG/ML IV SOLN: 1.0000 mg | INTRAVENOUS | Status: DC | PRN

## 2022-06-28 MED ORDER — METHOCARBAMOL 500 MG PO TABS
500.0000 mg | ORAL_TABLET | Freq: Three times a day (TID) | ORAL | Status: DC
  Administered 2022-06-28 – 2022-06-29 (×2): 500 mg via ORAL
  Filled 2022-06-28 (×2): qty 1

## 2022-06-28 MED ORDER — ONDANSETRON HCL 4 MG/2ML IJ SOLN: 4.0000 mg | Freq: Four times a day (QID) | INTRAMUSCULAR | Status: DC | PRN

## 2022-06-28 MED ORDER — LINAGLIPTIN 5 MG PO TABS
5.0000 mg | ORAL_TABLET | Freq: Every day | ORAL | Status: DC
  Administered 2022-06-29: 5 mg via ORAL
  Filled 2022-06-28 (×2): qty 1

## 2022-06-28 MED ORDER — GLIPIZIDE ER 2.5 MG PO TB24
2.5000 mg | ORAL_TABLET | Freq: Every day | ORAL | Status: DC
  Administered 2022-06-29: 2.5 mg via ORAL
  Filled 2022-06-28: qty 1

## 2022-06-28 MED ORDER — LOSARTAN POTASSIUM 50 MG PO TABS
100.0000 mg | ORAL_TABLET | Freq: Every day | ORAL | Status: DC
Start: 2022-06-28 — End: 2022-06-28
  Administered 2022-06-28: 100 mg via ORAL
  Filled 2022-06-28: qty 4

## 2022-06-28 MED ORDER — FUROSEMIDE 20 MG PO TABS
20.0000 mg | ORAL_TABLET | ORAL | Status: DC
Start: 2022-06-28 — End: 2022-06-28
  Administered 2022-06-28: 20 mg via ORAL
  Filled 2022-06-28: qty 1

## 2022-06-28 MED ORDER — INSULIN ASPART 100 UNIT/ML IJ SOLN
0.0000 [IU] | Freq: Three times a day (TID) | INTRAMUSCULAR | Status: DC
  Administered 2022-06-28: 2 [IU] via SUBCUTANEOUS
  Administered 2022-06-29: 1 [IU] via SUBCUTANEOUS
  Administered 2022-06-29: 3 [IU] via SUBCUTANEOUS

## 2022-06-28 MED ORDER — AMLODIPINE BESYLATE 5 MG PO TABS
5.0000 mg | ORAL_TABLET | Freq: Two times a day (BID) | ORAL | Status: DC
  Administered 2022-06-28 – 2022-06-29 (×4): 5 mg via ORAL
  Filled 2022-06-28 (×4): qty 1

## 2022-06-28 MED ORDER — HEPARIN SODIUM (PORCINE) 5000 UNIT/ML IJ SOLN
5000.0000 [IU] | Freq: Three times a day (TID) | INTRAMUSCULAR | Status: DC
Start: 2022-06-28 — End: 2022-06-29
  Administered 2022-06-28 – 2022-06-29 (×2): 5000 [IU] via SUBCUTANEOUS
  Filled 2022-06-28 (×3): qty 1

## 2022-06-28 MED ORDER — ACETAMINOPHEN 500 MG PO TABS
1000.0000 mg | ORAL_TABLET | Freq: Four times a day (QID) | ORAL | Status: DC
  Administered 2022-06-28 – 2022-06-29 (×4): 1000 mg via ORAL
  Filled 2022-06-28 (×4): qty 2

## 2022-06-28 MED ORDER — METOPROLOL TARTRATE 50 MG PO TABS
50.0000 mg | ORAL_TABLET | Freq: Two times a day (BID) | ORAL | Status: DC
  Administered 2022-06-28 – 2022-06-29 (×4): 50 mg via ORAL
  Filled 2022-06-28: qty 1
  Filled 2022-06-28: qty 2
  Filled 2022-06-28: qty 1
  Filled 2022-06-28: qty 2

## 2022-06-28 MED ORDER — ACETAMINOPHEN 650 MG RE SUPP: 650.0000 mg | Freq: Four times a day (QID) | RECTAL | Status: DC | PRN

## 2022-06-28 MED ORDER — METHOCARBAMOL 500 MG PO TABS: 1000.0000 mg | ORAL_TABLET | Freq: Three times a day (TID) | ORAL | Status: DC

## 2022-06-28 MED ORDER — ATORVASTATIN CALCIUM 10 MG PO TABS
10.0000 mg | ORAL_TABLET | ORAL | Status: DC
  Administered 2022-06-28: 10 mg via ORAL
  Filled 2022-06-28: qty 1

## 2022-06-28 MED ORDER — HALOPERIDOL LACTATE 5 MG/ML IJ SOLN
2.0000 mg | Freq: Four times a day (QID) | INTRAMUSCULAR | Status: DC | PRN
  Administered 2022-06-29: 2 mg via INTRAMUSCULAR
  Filled 2022-06-28: qty 1

## 2022-06-28 MED ORDER — ACETAMINOPHEN 325 MG PO TABS: 650.0000 mg | ORAL_TABLET | Freq: Four times a day (QID) | ORAL | Status: DC | PRN

## 2022-06-28 MED ORDER — CLONIDINE HCL 0.2 MG PO TABS
0.2000 mg | ORAL_TABLET | Freq: Every day | ORAL | Status: DC
  Administered 2022-06-28: 0.2 mg via ORAL
  Filled 2022-06-28: qty 2

## 2022-06-28 MED ORDER — ASPIRIN 81 MG PO TBEC
81.0000 mg | DELAYED_RELEASE_TABLET | Freq: Every morning | ORAL | Status: DC
  Administered 2022-06-28 – 2022-06-29 (×2): 81 mg via ORAL
  Filled 2022-06-28 (×2): qty 1

## 2022-06-28 MED ORDER — ALOGLIPTIN BENZOATE 25 MG PO TABS: 25.0000 mg | ORAL_TABLET | Freq: Every day | ORAL | Status: DC

## 2022-06-28 MED ORDER — INSULIN ASPART 100 UNIT/ML IJ SOLN: 0.0000 [IU] | Freq: Every day | INTRAMUSCULAR | Status: DC

## 2022-06-28 MED ORDER — ISOSORBIDE MONONITRATE ER 60 MG PO TB24
60.0000 mg | ORAL_TABLET | Freq: Every day | ORAL | Status: DC
  Administered 2022-06-29: 60 mg via ORAL
  Filled 2022-06-28 (×2): qty 1

## 2022-06-28 MED ORDER — CLONIDINE HCL 0.1 MG PO TABS
0.1000 mg | ORAL_TABLET | Freq: Three times a day (TID) | ORAL | Status: DC
  Administered 2022-06-28: 0.1 mg via ORAL
  Filled 2022-06-28: qty 1

## 2022-06-28 MED ORDER — OXYCODONE HCL 5 MG/5ML PO SOLN: 2.5000 mg | ORAL | Status: DC | PRN

## 2022-06-28 MED ORDER — GLIPIZIDE ER 5 MG PO TB24
5.0000 mg | ORAL_TABLET | Freq: Every day | ORAL | Status: DC
Start: 2022-06-28 — End: 2022-06-28
  Filled 2022-06-28: qty 1

## 2022-06-28 MED ORDER — HYDRALAZINE HCL 20 MG/ML IJ SOLN: 10.0000 mg | Freq: Four times a day (QID) | INTRAMUSCULAR | Status: DC | PRN

## 2022-06-28 NOTE — H&P (Addendum)
TRH H&P   Patient Demographics:    Michaela Herrera, is a 88 y.o. female  MRN: [id number]   DOB - 06-03-1928  Admit Date - 06/27/2022  Outpatient Primary MD for the patient is [name], MD  Patient coming from: Home  Chief Complaint  Patient presents with   Fall      HPI:    Michaela Herrera  is a 88 y.o. female, with history of hypertension, hypertensive and diabetic retinopathy, DM type 2 insulin-dependent, previous history of osteoporosis, dyslipidemia, vitamin B12 deficiency who lives with her daughter at home with is quite functional, according to the daughter patient has been getting gradually weak for the last few days, last Friday evening she went to the bathroom and when she was trying to get up her right knee gave up and she fell, she hit her right chest on the side of the boat and then bathroom floor, she was brought to an outlying ER where she was diagnosed with right-sided rib fractures along with dehydration and hypercalcemia.  She was subsequently brought to [facility] for further care.  Currently besides ongoing right-sided sharp pleuritic chest pain increases with deep breath and is better with rest and pain medications, nonradiating, pain ongoing since Friday, no other associated terms, along with some acute on chronic right knee pain due to arthritis she has no other symptoms except feeling mild generalized weakness.  Denies any headache, no new problems with vision or hearing, no chest pain or palpitations besides rib cage pain as above, no abdominal pain, no diarrhea or dysuria, no focal weakness, no recent weight loss or change in medications.    Review of systems:    A full 10 point  Review of Systems was done, except as stated above, all other Review of Systems were negative.   With Past History of the following :    Past Medical History:  Diagnosis Date   Diabetes mellitus without complication (HCC)    Diabetic retinopathy (HCC)    NPDR OU   GERD (gastroesophageal reflux disease)    Hypertension    Hypertensive retinopathy    OU      Past Surgical History:  Procedure Laterality Date   APPENDECTOMY     CATARACT EXTRACTION Bilateral    COLONOSCOPY  2005   EYE SURGERY Bilateral  Cat Sx      Social History:     Social History   Tobacco Use   Smoking status: Never   Smokeless tobacco: Never  Substance Use Topics   Alcohol use: No         Family History :   No H/O Hypercalcemia   Home Medications:   Prior to Admission medications   Medication Sig Start Date End Date Taking? Authorizing Provider  amLODipine (NORVASC) 5 MG tablet Take 5 mg by mouth 2 (two) times daily. 12/11/15  Yes [provider]  aspirin EC 81 MG tablet Take 81 mg by mouth in the morning. Swallow whole.   Yes [provider]  atorvastatin (LIPITOR) 10 MG tablet Take 10 mg by mouth See admin instructions. Take 10 mg by mouth at bedtime on Mon/Wed/Fri only 07/07/20  Yes [provider]  Calcium Carb-Cholecalciferol (CALCIUM + D3 PO) Take 1 tablet by mouth daily with breakfast.   Yes [provider]  cloNIDine (CATAPRES) 0.2 MG tablet Take 1 tablet (0.2 mg total) by mouth at bedtime. 01/23/16  Yes Dixie Dials, MD  Cyanocobalamin (VITAMIN B 12 PO) Take 1 tablet by mouth daily with breakfast.   Yes [provider]  furosemide (LASIX) 20 MG tablet Take 1 tablet (20 mg total) by mouth every Monday, Wednesday, and Friday. Patient taking differently: Take 20 mg by mouth in the morning. 01/23/16  Yes Dixie Dials, MD  glipiZIDE (GLUCOTROL XL) 5 MG 24 hr tablet Take 1 tablet (5 mg total) by mouth daily with breakfast. Patient taking  differently: Take 2.5 mg by mouth daily with breakfast. 01/23/16  Yes Dixie Dials, MD  isosorbide mononitrate (IMDUR) 60 MG 24 hr tablet Take 60 mg by mouth daily. 12/04/15  Yes [provider]  JANUVIA 100 MG tablet Take 50 mg by mouth daily. 10/07/20  Yes [provider]  JARDIANCE 10 MG TABS tablet Take 10 mg by mouth daily. 06/02/22  Yes [provider]  losartan (COZAAR) 100 MG tablet Take 100 mg by mouth daily. 12/11/15  Yes [provider]  metFORMIN (GLUCOPHAGE) 500 MG tablet Take 500 mg by mouth 2 (two) times daily with a meal.   Yes [provider]  metoprolol (LOPRESSOR) 50 MG tablet Take 50 mg by mouth in the morning. 12/04/15  Yes [provider]  potassium chloride (K-DUR,KLOR-CON) 10 MEQ tablet Take 10 mEq by mouth daily.   Yes [provider]  Alogliptin Benzoate (NESINA) 25 MG TABS Take 25 mg by mouth daily. Patient not taking: Reported on 06/28/2022    [provider]  doxycycline (MONODOX) 100 MG capsule Take 100 mg by mouth 2 (two) times daily. Patient not taking: Reported on 06/28/2022 06/22/22   [provider]  insulin aspart (NOVOLOG) 100 UNIT/ML injection Inject 0-9 Units into the skin 3 (three) times daily with meals. Patient not taking: Reported on 06/28/2022 12/17/15   Dixie Dials, MD  MAXITROL 3.5-10000-0.1 Ruby SMARTSIG:0.25 sparingly In Eye(s) Twice Daily Patient not taking: Reported on 06/28/2022 06/21/22   [provider]     Allergies:    No Known Allergies   Physical Exam:   Vitals  Blood pressure (!) 160/70, pulse 71, temperature 98.2 F (36.8 C), temperature source Oral, resp. rate 20, height 5\' 4"  (1.626 m), weight 57.5 kg, SpO2 95 %.   1. General -pleasant elderly African-American female who appears much younger than her stated age lying in hospital bed in no apparent discomfort,  2.  Normal affect and insight, Not Suicidal or Homicidal, Awake Alert,   3. No F.N  deficits, ALL C.Nerves Intact, Strength 5/5 all 4 extremities, Sensation intact all 4 extremities, Plantars down going.  4. Ears and Eyes appear Normal, Conjunctivae clear, PERRLA.  Dry oral mucosa  5. Supple Neck, No JVD, No cervical lymphadenopathy appriciated, No Carotid Bruits.  6. Symmetrical Chest wall movement, Good air movement bilaterally, CTAB.  7. RRR, No Gallops, Rubs or Murmurs, No Parasternal Heave.  8. Positive Bowel Sounds, Abdomen Soft, No tenderness, No organomegaly appriciated,No rebound -guarding or rigidity.  9.  No Cyanosis, Normal Skin Turgor, No Skin Rash or Bruise.  10. Good muscle tone,  joints appear normal , no effusions, Normal ROM.  11. No Palpable Lymph Nodes in Neck or Axillae      Data Review:   Recent Labs  Lab 06/27/22 1540  WBC 13.9*  HGB 14.6  HCT 44.4  PLT 300  MCV 87.6  MCH 28.8  MCHC 32.9  RDW 13.6  LYMPHSABS 2.9  MONOABS 0.9  EOSABS 0.3  BASOSABS 0.1    Recent Labs  Lab 06/27/22 1540 06/27/22 1841  NA 136  --   K 4.3  --   CL 102  --   CO2 26  --   GLUCOSE 113*  --   BUN 27*  --   CREATININE 1.35*  --   CALCIUM 13.5*  --   AST 15  --   ALT 10  --   ALKPHOS 142*  --   BILITOT 0.6  --   ALBUMIN 4.8  --   MG  --  1.9  PHOS  --  2.4*  AMMONIA  --  33    Urinalysis    Component Value Date/Time   COLORURINE COLORLESS (A) 06/27/2022 1541   APPEARANCEUR CLEAR 06/27/2022 1541   LABSPEC 1.005 06/27/2022 1541   PHURINE 5.0 06/27/2022 1541   GLUCOSEU >1,000 (A) 06/27/2022 1541   HGBUR NEGATIVE 06/27/2022 1541   BILIRUBINUR NEGATIVE 06/27/2022 1541   KETONESUR NEGATIVE 06/27/2022 1541   PROTEINUR NEGATIVE 06/27/2022 1541   NITRITE NEGATIVE 06/27/2022 1541   LEUKOCYTESUR NEGATIVE 06/27/2022 1541      Imaging Results:    CT Chest W Contrast  Result Date: 06/27/2022 CLINICAL DATA:  Fall, right-sided chest trauma EXAM: CT CHEST WITH CONTRAST TECHNIQUE: Multidetector CT imaging of the chest was performed during  intravenous contrast administration. RADIATION DOSE REDUCTION: This exam was performed according to the departmental dose-optimization program which includes automated exposure control, adjustment of the mA and/or kV according to patient size and/or use of iterative reconstruction technique. CONTRAST:  56mL OMNIPAQUE IOHEXOL 300 MG/ML  SOLN COMPARISON:  None Available. FINDINGS: Cardiovascular: Aortic atherosclerosis. Cardiomegaly. Three-vessel coronary artery calcifications. Enlargement of the main pulmonary artery measuring up to 3.9 cm in caliber. No pericardial effusion. Mediastinum/Nodes: No enlarged mediastinal, hilar, or axillary lymph nodes. Small hiatal hernia. Thyroid gland, trachea, and esophagus demonstrate no significant findings. Lungs/Pleura: Elevation of the right hemidiaphragm, possibly related to splinting. Scarring and or atelectasis of the right lung base. No pleural effusion or pneumothorax. Upper Abdomen: No acute abnormality. Musculoskeletal: Mildly displaced fractures of the lateral right sixth, seventh, and eighth ribs (series 2, image 52 62, 72). IMPRESSION: 1. Mildly displaced fractures of the lateral right sixth, seventh, and eighth ribs. 2. No associated pneumothorax or pleural effusion. 3. Elevation of the right hemidiaphragm, possibly related to splinting. 4. Cardiomegaly and coronary artery disease. 5. Enlargement of the main pulmonary artery,  as can be seen in pulmonary hypertension. Aortic Atherosclerosis (ICD10-I70.0). Electronically Signed   By: Delanna Ahmadi M.D.   On: 06/27/2022 19:18   DG Knee Complete 4 Views Right  Result Date: 06/27/2022 CLINICAL DATA:  Fall from toilet, right knee pain EXAM: RIGHT KNEE - COMPLETE 4+ VIEW COMPARISON:  None Available. FINDINGS: No evidence of fracture, dislocation, or joint effusion. Moderate tricompartmental joint space narrowing and osteophytosis. Vascular calcinosis. IMPRESSION: No fracture or dislocation of the right knee. Moderate  tricompartmental osteoarthritis. Electronically Signed   By: Delanna Ahmadi M.D.   On: 06/27/2022 16:28   DG Ribs Unilateral W/Chest Right  Result Date: 06/27/2022 CLINICAL DATA:  Fall, right rib pain EXAM: RIGHT RIBS AND CHEST - 3+ VIEW COMPARISON:  06/30/2016 FINDINGS: Mildly displaced fractures of the lateral right sixth through eighth ribs. There is no evidence of pneumothorax. Probable trace bilateral pleural effusions. Both lungs are clear. Cardiomegaly. IMPRESSION: 1. Mildly displaced fractures of the lateral right sixth through eighth ribs. No pneumothorax. 2. Probable trace bilateral pleural effusions. 3. Cardiomegaly. Electronically Signed   By: Delanna Ahmadi M.D.   On: 06/27/2022 16:27    My personal review of EKG: Rhythm NSR,  no Acute ST changes   Assessment & Plan:     1.  Hypercalcemia induced dehydration, generalized weakness and mechanical fall causing right-sided rib cage fracture.  Will be admitted to the hospital, per [facility] work-up including checking PTH, PTH RP, ACE levels, vitamin D levels, TSH, hydrate with IV fluids, calcitonin nasal spray, Lasix once she is fully hydrated.  If needed bisphosphonate treatment.  Hold her oral calcium supplementation which can cause hypercalcemia but doubt it would cause it to this extent, monitor.  2.  Mechanical fall with 3 right-sided rib cage fractures.  Due to #1 above, plan as in above, PT OT, will inform trauma service as well.  3.  Essential hypertension.  Continue home medications which will include Norvasc twice daily, Catapres, hold ARB as she is dehydrated and AKI, as needed IV hydralazine and monitor.  4.  Dehydration with AKI.  As in #1 above, hydrate, for now hold Lasix and ARB.  5.  Arthritis of the right knee.  Supportive care.  6.  Dyslipidemia.  Continue home dose statin.  7.  Possible age-related cognitive decline.  Some episodes of delirium at home at night per daughter, at risk for delirium, daughter counseled,  as needed Haldol.   8. DM type II.  Half home dose Glucotrol, sliding scale.  Hold Jardiance as she is dehydrated.  Check A1c.   DVT Prophylaxis Heparin   AM Labs Ordered, also please review Full Orders  Family Communication: Admission, patients condition and plan of care including tests being ordered have been discussed with the patient and daughter who indicate understanding and agree with the plan and Code Status.  Code Status DNR  Likely DC to  TBD  Condition GUARDED    Consults called: Trauma    Admission status: Directly admitted by overnight MD as observation.  Time spent in minutes : 35   Lala Lund M.D on 06/28/2022 at 4:59 PM  To page go to [email or web address] - password [email or web address]

## 2022-06-28 NOTE — ED Notes (Signed)
Attempt to call report.  Floor nurse unavailable.  States will call back

## 2022-06-28 NOTE — Consult Note (Signed)
Reason for Consult:fall, rib fx Referring Physician: Dr Frederica Kuster is an 88 y.o. female.  HPI: 34 yof with dm, htn lives with daughter who is with her tonight had a fall Friday.  She then complained about right sided chest pain not getting better. Was taken to outside er where she had negative right knee films, ct without htpx and has three right rib fx along with other medical issues.  We were asked to see due to trauma history.   Past Medical History:  Diagnosis Date   Diabetes mellitus without complication (HCC)    Diabetic retinopathy (HCC)    NPDR OU   GERD (gastroesophageal reflux disease)    Hypertension    Hypertensive retinopathy    OU    Past Surgical History:  Procedure Laterality Date   APPENDECTOMY     CATARACT EXTRACTION Bilateral    COLONOSCOPY  2005   EYE SURGERY Bilateral    Cat Sx    History reviewed. No pertinent family history.  Social History:  reports that she has never smoked. She has never used smokeless tobacco. She reports that she does not drink alcohol and does not use drugs.  Allergies: No Known Allergies  No current facility-administered medications on file prior to encounter.   Current Outpatient Medications on File Prior to Encounter  Medication Sig Dispense Refill   amLODipine (NORVASC) 5 MG tablet Take 5 mg by mouth 2 (two) times daily.  6   aspirin EC 81 MG tablet Take 81 mg by mouth in the morning. Swallow whole.     atorvastatin (LIPITOR) 10 MG tablet Take 10 mg by mouth See admin instructions. Take 10 mg by mouth at bedtime on Mon/Wed/Fri only     Calcium Carb-Cholecalciferol (CALCIUM + D3 PO) Take 1 tablet by mouth daily with breakfast.     cloNIDine (CATAPRES) 0.2 MG tablet Take 1 tablet (0.2 mg total) by mouth at bedtime.     Cyanocobalamin (VITAMIN B 12 PO) Take 1 tablet by mouth daily with breakfast.     furosemide (LASIX) 20 MG tablet Take 1 tablet (20 mg total) by mouth every Monday, Wednesday, and Friday. (Patient  taking differently: Take 20 mg by mouth in the morning.)     glipiZIDE (GLUCOTROL XL) 5 MG 24 hr tablet Take 1 tablet (5 mg total) by mouth daily with breakfast. (Patient taking differently: Take 2.5 mg by mouth daily with breakfast.)     isosorbide mononitrate (IMDUR) 60 MG 24 hr tablet Take 60 mg by mouth daily.  5   JANUVIA 100 MG tablet Take 50 mg by mouth daily.     JARDIANCE 10 MG TABS tablet Take 10 mg by mouth daily.     losartan (COZAAR) 100 MG tablet Take 100 mg by mouth daily.  2   metFORMIN (GLUCOPHAGE) 500 MG tablet Take 500 mg by mouth 2 (two) times daily with a meal.     metoprolol (LOPRESSOR) 50 MG tablet Take 50 mg by mouth in the morning.  5   potassium chloride (K-DUR,KLOR-CON) 10 MEQ tablet Take 10 mEq by mouth daily.     Alogliptin Benzoate (NESINA) 25 MG TABS Take 25 mg by mouth daily. (Patient not taking: Reported on 06/28/2022)     doxycycline (MONODOX) 100 MG capsule Take 100 mg by mouth 2 (two) times daily. (Patient not taking: Reported on 06/28/2022)     insulin aspart (NOVOLOG) 100 UNIT/ML injection Inject 0-9 Units into the skin 3 (three) times daily  with meals. (Patient not taking: Reported on 06/28/2022) 10 mL 11   MAXITROL 3.5-10000-0.1 OINT SMARTSIG:0.25 sparingly In Eye(s) Twice Daily (Patient not taking: Reported on 06/28/2022)       Results for orders placed or performed during the hospital encounter of 06/27/22 (from the past 48 hour(s))  CBC with Differential     Status: Abnormal   Collection Time: 06/27/22  3:40 PM  Result Value Ref Range   WBC 13.9 (H) 4.0 - 10.5 K/uL   RBC 5.07 3.87 - 5.11 MIL/uL   Hemoglobin 14.6 12.0 - 15.0 g/dL   HCT 15.1 76.1 - 60.7 %   MCV 87.6 80.0 - 100.0 fL   MCH 28.8 26.0 - 34.0 pg   MCHC 32.9 30.0 - 36.0 g/dL   RDW 37.1 06.2 - 69.4 %   Platelets 300 150 - 400 K/uL   nRBC 0.0 0.0 - 0.2 %   Neutrophils Relative % 71 %   Neutro Abs 9.8 (H) 1.7 - 7.7 K/uL   Lymphocytes Relative 21 %   Lymphs Abs 2.9 0.7 - 4.0 K/uL    Monocytes Relative 6 %   Monocytes Absolute 0.9 0.1 - 1.0 K/uL   Eosinophils Relative 2 %   Eosinophils Absolute 0.3 0.0 - 0.5 K/uL   Basophils Relative 0 %   Basophils Absolute 0.1 0.0 - 0.1 K/uL   Immature Granulocytes 0 %   Abs Immature Granulocytes 0.03 0.00 - 0.07 K/uL    Comment: Performed at [facility], [address] [place], [place] [address]  Comprehensive metabolic panel     Status: Abnormal   Collection Time: 06/27/22  3:40 PM  Result Value Ref Range   Sodium 136 135 - 145 mmol/L   Potassium 4.3 3.5 - 5.1 mmol/L   Chloride 102 98 - 111 mmol/L   CO2 26 22 - 32 mmol/L   Glucose, Bld 113 (H) 70 - 99 mg/dL    Comment: Glucose reference range applies only to samples taken after fasting for at least 8 hours.   BUN 27 (H) 8 - 23 mg/dL   Creatinine, Ser 7.03 (H) 0.44 - 1.00 mg/dL   Calcium 50.0 (HH) 8.9 - 10.3 mg/dL    Comment: CRITICAL RESULT CALLED TO, READ BACK BY AND VERIFIED WITH: SMITH,T AT 1833 ON 06/27/2022 BY MOSLEY,J    Total Protein 8.1 6.5 - 8.1 g/dL   Albumin 4.8 3.5 - 5.0 g/dL   AST 15 15 - 41 U/L   ALT 10 0 - 44 U/L   Alkaline Phosphatase 142 (H) 38 - 126 U/L   Total Bilirubin 0.6 0.3 - 1.2 mg/dL   GFR, Estimated 37 (L) >60 mL/min    Comment: (NOTE) Calculated using the CKD-EPI Creatinine Equation (2021)    Anion gap 8 5 - 15    Comment: Performed at [facility], [address], [place], [place] [address]  Urinalysis, Routine w reflex microscopic Urine, Clean Catch     Status: Abnormal   Collection Time: 06/27/22  3:41 PM  Result Value Ref Range   Color, Urine COLORLESS (A) YELLOW   APPearance CLEAR CLEAR   Specific Gravity, Urine 1.005 1.005 - 1.030   pH 5.0 5.0 - 8.0   Glucose, UA >1,000 (A) NEGATIVE mg/dL   Hgb urine dipstick NEGATIVE NEGATIVE   Bilirubin Urine NEGATIVE NEGATIVE   Ketones, ur NEGATIVE NEGATIVE mg/dL   Protein, ur NEGATIVE NEGATIVE mg/dL   Nitrite NEGATIVE NEGATIVE   Leukocytes,Ua  NEGATIVE NEGATIVE  RBC / HPF 0-5 0 - 5 RBC/hpf   WBC, UA 0-5 0 - 5 WBC/hpf   Mucus PRESENT     Comment: Performed at [facility], [address], [place], [place] [address]  VITAMIN D 25 Hydroxy (Vit-D Deficiency, Fractures)     Status: None   Collection Time: 06/27/22  6:41 PM  Result Value Ref Range   Vit D, 25-Hydroxy 42.05 30 - 100 ng/mL    Comment: (NOTE) Vitamin D deficiency has been defined by the Institute of Medicine  and an Endocrine Society practice guideline as a level of serum 25-OH  vitamin D less than 20 ng/mL (1,2). The Endocrine Society went on to  further define vitamin D insufficiency as a level between 21 and 29  ng/mL (2).  1. IOM (Institute of Medicine). 2010. Dietary reference intakes for  calcium and D. [place]: [employer]. 2. Holick MF, Binkley [place], Bischoff-Ferrari HA, et al. Evaluation,  treatment, and prevention of vitamin D deficiency: an Endocrine  Society clinical practice guideline, JCEM. 2011 Jul; 96(7): 1911-30.  Performed at [facility], [address], [place], [place] [address]   Magnesium     Status: None   Collection Time: 06/27/22  6:41 PM  Result Value Ref Range   Magnesium 1.9 1.7 - 2.4 mg/dL    Comment: Performed at [facility], [address], [place], [place] [address]  Ammonia     Status: None   Collection Time: 06/27/22  6:41 PM  Result Value Ref Range   Ammonia 33 9 - 35 umol/L    Comment: Performed at [facility], [address], [place], [place] [address]  Phosphorus     Status: Abnormal   Collection Time: 06/27/22  6:41 PM  Result Value Ref Range   Phosphorus 2.4 (L) 2.5 - 4.6 mg/dL    Comment: Performed at [facility], [address] [place], [place] [address]  Glucose, capillary     Status: Abnormal   Collection Time: 06/28/22  5:18 PM  Result Value Ref Range   Glucose-Capillary 155 (H) 70 - 99 mg/dL     Comment: Glucose reference range applies only to samples taken after fasting for at least 8 hours.  Hemoglobin A1c     Status: Abnormal   Collection Time: 06/28/22  6:43 PM  Result Value Ref Range   Hgb A1c MFr Bld 6.6 (H) 4.8 - 5.6 %    Comment: (NOTE) Pre diabetes:          5.7%-6.4%  Diabetes:              >6.4%  Glycemic control for   <7.0% adults with diabetes    Mean Plasma Glucose 142.72 mg/dL    Comment: Performed at [facility], [address] [place], [place] [address]  CBC     Status: Abnormal   Collection Time: 06/28/22  6:43 PM  Result Value Ref Range   WBC 15.7 (H) 4.0 - 10.5 K/uL   RBC 4.79 3.87 - 5.11 MIL/uL   Hemoglobin 13.6 12.0 - 15.0 g/dL   HCT 03.5 46.5 - 68.1 %   MCV 86.6 80.0 - 100.0 fL   MCH 28.4 26.0 - 34.0 pg   MCHC 32.8 30.0 - 36.0 g/dL   RDW 27.5 17.0 - 01.7 %   Platelets 262 150 - 400 K/uL   nRBC 0.0 0.0 - 0.2 %    Comment: Performed at [facility]  [facility], [address] [place], [place] [id number]    CT Chest W Contrast  Result Date: 06/27/2022 CLINICAL DATA:  Fall, right-sided chest trauma EXAM: CT CHEST WITH CONTRAST TECHNIQUE: Multidetector CT imaging of the chest was performed during intravenous contrast administration. RADIATION DOSE REDUCTION: This exam was performed according to the departmental dose-optimization program which includes automated exposure control, adjustment of the mA and/or kV according to patient size and/or use of iterative reconstruction technique. CONTRAST:  50mL OMNIPAQUE IOHEXOL 300 MG/ML  SOLN COMPARISON:  None Available. FINDINGS: Cardiovascular: Aortic atherosclerosis. Cardiomegaly. Three-vessel coronary artery calcifications. Enlargement of the main pulmonary artery measuring up to 3.9 cm in caliber. No pericardial effusion. Mediastinum/Nodes: No enlarged mediastinal, hilar, or axillary lymph nodes. Small hiatal hernia. Thyroid gland, trachea, and esophagus demonstrate no significant findings. Lungs/Pleura:  Elevation of the right hemidiaphragm, possibly related to splinting. Scarring and or atelectasis of the right lung base. No pleural effusion or pneumothorax. Upper Abdomen: No acute abnormality. Musculoskeletal: Mildly displaced fractures of the lateral right sixth, seventh, and eighth ribs (series 2, image 52 62, 72). IMPRESSION: 1. Mildly displaced fractures of the lateral right sixth, seventh, and eighth ribs. 2. No associated pneumothorax or pleural effusion. 3. Elevation of the right hemidiaphragm, possibly related to splinting. 4. Cardiomegaly and coronary artery disease. 5. Enlargement of the main pulmonary artery, as can be seen in pulmonary hypertension. Aortic Atherosclerosis (ICD10-I70.0). Electronically Signed   By: Jearld LeschAlex D Bibbey M.D.   On: 06/27/2022 19:18   DG Knee Complete 4 Views Right  Result Date: 06/27/2022 CLINICAL DATA:  Fall from toilet, right knee pain EXAM: RIGHT KNEE - COMPLETE 4+ VIEW COMPARISON:  None Available. FINDINGS: No evidence of fracture, dislocation, or joint effusion. Moderate tricompartmental joint space narrowing and osteophytosis. Vascular calcinosis. IMPRESSION: No fracture or dislocation of the right knee. Moderate tricompartmental osteoarthritis. Electronically Signed   By: Jearld LeschAlex D Bibbey M.D.   On: 06/27/2022 16:28   DG Ribs Unilateral W/Chest Right  Result Date: 06/27/2022 CLINICAL DATA:  Fall, right rib pain EXAM: RIGHT RIBS AND CHEST - 3+ VIEW COMPARISON:  06/30/2016 FINDINGS: Mildly displaced fractures of the lateral right sixth through eighth ribs. There is no evidence of pneumothorax. Probable trace bilateral pleural effusions. Both lungs are clear. Cardiomegaly. IMPRESSION: 1. Mildly displaced fractures of the lateral right sixth through eighth ribs. No pneumothorax. 2. Probable trace bilateral pleural effusions. 3. Cardiomegaly. Electronically Signed   By: Jearld LeschAlex D Bibbey M.D.   On: 06/27/2022 16:27    Review of Systems  Cardiovascular:  Positive for chest  pain (right).  Musculoskeletal:  Positive for arthralgias.  All other systems reviewed and are negative.  Blood pressure (!) 170/79, pulse 70, temperature 97.9 F (36.6 C), temperature source Oral, resp. rate 16, height 5\' 4"  (1.626 m), weight 57.5 kg, SpO2 95 %. Physical Exam Constitutional:      General: She is not in acute distress.    Appearance: Normal appearance.  HENT:     Head: Normocephalic and atraumatic.     Nose: Nose normal.  Eyes:     General: No scleral icterus.    Extraocular Movements: Extraocular movements intact.  Cardiovascular:     Rate and Rhythm: Normal rate and regular rhythm.     Pulses: Normal pulses.  Pulmonary:     Effort: Pulmonary effort is normal.     Breath sounds: No wheezing.  Chest:     Chest wall: Tenderness (right) present.  Abdominal:     Palpations: Abdomen is soft.  Tenderness: There is no abdominal tenderness.  Musculoskeletal:        General: Tenderness (right knee mild) present.     Cervical back: Normal range of motion and neck supple.     Right lower leg: No edema.     Left lower leg: No edema.  Skin:    General: Skin is warm and dry.  Neurological:     General: No focal deficit present.     Mental Status: She is alert.  Psychiatric:        Mood and Affect: Mood normal.        Behavior: Behavior normal.     Assessment/Plan: Fall Right 6-8 rib fx, mildly displaced- she is very comfortable just on tylenol now, needs ics as written for ( not in room yet), pulm toilet Right knee pain- negative xrays PT/OT in am Evaluation for dehydration/metabolic derangements per [facility] Will follow with you    This care required moderate level of medical decision making.    Emelia Loron 06/28/2022, 7:53 PM

## 2022-06-28 NOTE — ED Notes (Signed)
Handoff report given to [facility] on 5N  at [facility]

## 2022-06-28 NOTE — Progress Notes (Signed)
Consult rec'd for rib fx. CT chest reviewed. Pain regimen adjusted, PT/OT/IS ordered. Full consult to follow.   Diamantina Monks, MD General and Trauma Surgery [facility]

## 2022-06-28 NOTE — TOC CAGE-AID Note (Signed)
Transition of Care [facility]) - CAGE-AID Screening   Patient Details  Name: Michaela Herrera MRN: [id number] Date of Birth: August 14, 1928  Clinical Narrative: Patient denies any alcohol or drug use, no need for resources at this time.   CAGE-AID Screening:    Have You Ever Felt You Ought to Cut Down on Your Drinking or Drug Use?: No Have People Annoyed You By Critizing Your Drinking Or Drug Use?: No Have You Felt Bad Or Guilty About Your Drinking Or Drug Use?: No Have You Ever Had a Drink or Used Drugs First Thing In The Morning to Steady Your Nerves or to Get Rid of a Hangover?: No CAGE-AID Score: 0  Substance Abuse Education Offered: No

## 2022-06-28 NOTE — ED Notes (Signed)
Handoff report given to [facility] 

## 2022-06-29 ENCOUNTER — Other Ambulatory Visit (HOSPITAL_COMMUNITY): Payer: Self-pay

## 2022-06-29 LAB — CALCIUM, IONIZED: Calcium, Ionized, Serum: 7.4 mg/dL — ABNORMAL HIGH (ref 4.5–5.6)

## 2022-06-29 LAB — CBC WITH DIFFERENTIAL/PLATELET
Abs Immature Granulocytes: 0.04 10*3/uL (ref 0.00–0.07)
Basophils Absolute: 0.1 10*3/uL (ref 0.0–0.1)
Basophils Relative: 1 %
Eosinophils Absolute: 0.4 10*3/uL (ref 0.0–0.5)
Eosinophils Relative: 3 %
HCT: 37.4 % (ref 36.0–46.0)
Hemoglobin: 12.5 g/dL (ref 12.0–15.0)
Immature Granulocytes: 0 %
Lymphocytes Relative: 27 %
Lymphs Abs: 3.7 10*3/uL (ref 0.7–4.0)
MCH: 28.9 pg (ref 26.0–34.0)
MCHC: 33.4 g/dL (ref 30.0–36.0)
MCV: 86.4 fL (ref 80.0–100.0)
Monocytes Absolute: 0.9 10*3/uL (ref 0.1–1.0)
Monocytes Relative: 6 %
Neutro Abs: 8.7 10*3/uL — ABNORMAL HIGH (ref 1.7–7.7)
Neutrophils Relative %: 63 %
Platelets: 231 10*3/uL (ref 150–400)
RBC: 4.33 MIL/uL (ref 3.87–5.11)
RDW: 13.3 % (ref 11.5–15.5)
WBC: 13.7 10*3/uL — ABNORMAL HIGH (ref 4.0–10.5)
nRBC: 0 % (ref 0.0–0.2)

## 2022-06-29 LAB — COMPREHENSIVE METABOLIC PANEL
ALT: 12 U/L (ref 0–44)
AST: 18 U/L (ref 15–41)
Albumin: 3.2 g/dL — ABNORMAL LOW (ref 3.5–5.0)
Alkaline Phosphatase: 102 U/L (ref 38–126)
Anion gap: 8 (ref 5–15)
BUN: 19 mg/dL (ref 8–23)
CO2: 21 mmol/L — ABNORMAL LOW (ref 22–32)
Calcium: 11.9 mg/dL — ABNORMAL HIGH (ref 8.9–10.3)
Chloride: 111 mmol/L (ref 98–111)
Creatinine, Ser: 1.06 mg/dL — ABNORMAL HIGH (ref 0.44–1.00)
GFR, Estimated: 49 mL/min — ABNORMAL LOW (ref >60)
Glucose, Bld: 116 mg/dL — ABNORMAL HIGH (ref 70–99)
Potassium: 3.8 mmol/L (ref 3.5–5.1)
Sodium: 140 mmol/L (ref 135–145)
Total Bilirubin: 0.7 mg/dL (ref 0.3–1.2)
Total Protein: 6 g/dL — ABNORMAL LOW (ref 6.5–8.1)

## 2022-06-29 LAB — VITAMIN D 25 HYDROXY (VIT D DEFICIENCY, FRACTURES): Vit D, 25-Hydroxy: 51.05 ng/mL (ref 30–100)

## 2022-06-29 LAB — ANGIOTENSIN CONVERTING ENZYME: Angiotensin-Converting Enzyme: 25 U/L (ref 14–82)

## 2022-06-29 LAB — PTH, INTACT AND CALCIUM
Calcium, Total (PTH): 13.3 mg/dL (ref 8.7–10.3)
PTH: 90 pg/mL — ABNORMAL HIGH (ref 15–65)

## 2022-06-29 LAB — GLUCOSE, CAPILLARY
Glucose-Capillary: 131 mg/dL — ABNORMAL HIGH (ref 70–99)
Glucose-Capillary: 237 mg/dL — ABNORMAL HIGH (ref 70–99)

## 2022-06-29 LAB — CALCITRIOL (1,25 DI-OH VIT D): Vit D, 1,25-Dihydroxy: 69.2 pg/mL (ref 24.8–81.5)

## 2022-06-29 LAB — BRAIN NATRIURETIC PEPTIDE: B Natriuretic Peptide: 112.1 pg/mL — ABNORMAL HIGH (ref 0.0–100.0)

## 2022-06-29 LAB — MAGNESIUM: Magnesium: 1.7 mg/dL (ref 1.7–2.4)

## 2022-06-29 MED ORDER — SODIUM CHLORIDE 0.9 % IV SOLN: INTRAVENOUS | Status: DC

## 2022-06-29 MED ORDER — ACETAMINOPHEN 500 MG PO TABS: 500.0000 mg | ORAL_TABLET | Freq: Three times a day (TID) | ORAL | 0 refills | Status: AC | PRN

## 2022-06-29 MED ORDER — FUROSEMIDE 20 MG PO TABS: 20.0000 mg | ORAL_TABLET | ORAL | Status: AC

## 2022-06-29 MED ORDER — POTASSIUM CHLORIDE CRYS ER 10 MEQ PO TBCR: 10.0000 meq | EXTENDED_RELEASE_TABLET | Freq: Every day | ORAL | Status: DC

## 2022-06-29 MED ORDER — CLONIDINE HCL 0.2 MG PO TABS
0.2000 mg | ORAL_TABLET | Freq: Three times a day (TID) | ORAL | 0 refills | Status: AC
  Filled 2022-06-29: qty 90, 30d supply, fill #0

## 2022-06-29 MED ORDER — LOSARTAN POTASSIUM 100 MG PO TABS: 100.0000 mg | ORAL_TABLET | Freq: Every day | ORAL | 2 refills | Status: DC

## 2022-06-29 MED ORDER — PROSOURCE PLUS PO LIQD
30.0000 mL | Freq: Two times a day (BID) | ORAL | Status: DC
  Administered 2022-06-29: 30 mL via ORAL
  Filled 2022-06-29: qty 30

## 2022-06-29 MED ORDER — CLONIDINE HCL 0.2 MG PO TABS
0.2000 mg | ORAL_TABLET | Freq: Three times a day (TID) | ORAL | Status: DC
  Administered 2022-06-29: 0.2 mg via ORAL
  Filled 2022-06-29: qty 1

## 2022-06-29 NOTE — TOC Transition Note (Addendum)
Transition of Care [facility]) - CM/SW Discharge Note   Patient Details  Name: Michaela Herrera MRN: [id number] Date of Birth: 03-03-1928  Transition of Care [facility]) CM/SW Contact:  Epifanio Lesches, RN Phone Number: 06/29/2022, 11:03 AM   Clinical Narrative:    Patient will DC to: home Anticipated DC date: 06/29/2022 Family notified: yes Transport by: car  Admitted from home after a fall. Suffered rib fx's on right side. Noted on admission also  hypercalcemia and dehydration. PMH includes HTN, diabetic retinopathy, DM2, and osteoporosis.  Per MD patient ready for DC today . RN, patient,  and patient's daughter notified of DC. Pt is from home with daughter. Daughter to assist with care once d/c. Order noted for home health PT services. Pt  agreeable to home health services.  Pt without reference. Referral made with [facility] and accepted. Pt without DME needs. Post hospital follow up noted on AVS. Pt without Rx med concern. TOC pharmacy to deliver Rx med to bedside prior to d/c.  Daughter to provide transportation to home.  RNCM will sign off for now as intervention is no longer needed. Please consult [place] again if new needs arise.    Final next level of care: Home w Home Health Services Barriers to Discharge: No Barriers Identified   Patient Goals and CMS Choice     Choice offered to / list presented to : Patient, Adult Children  Discharge Placement                       Discharge Plan and Services                          HH Arranged: PT [facility] Agency: [facility] Date [facility] Agency Contacted: 06/29/22 Time HH Agency Contacted: [phone] Representative spoke with at [facility] Agency: Victorino Dike  Social Determinants of Health (SDOH) Interventions     Readmission Risk Interventions     No data to display

## 2022-06-29 NOTE — Progress Notes (Signed)
IV removed. DC instructions given and explained. Pt daughter present for DC instructions. All questions answered. Pt transported home by daughter.

## 2022-06-29 NOTE — Discharge Summary (Signed)
Michaela Herrera RUE:[id number] DOB: Aug 15, 1928 DOA: 06/27/2022  PCP: [name], MD  Admit date: 06/27/2022  Discharge date: 06/29/2022  Admitted From: Home   Disposition:  Home   Recommendations for Outpatient Follow-up:   Follow up with PCP in 1-2 weeks  PCP Please obtain BMP/CBC, 2 view CXR in 1week,  (see Discharge instructions)   PCP Please follow up on the following pending results: Monitor CBC, CMP, BP closely,  PCP to kindly follow-up pending hypercalcemia work-up which includes PTH, PTH RP, ACE levels, vitamin D levels,   Home Health: PT   Equipment/Devices: as below  Consultations: Trauma Discharge Condition: Stable    CODE STATUS: Full    Diet Recommendation: Heart Healthy Low Carb   Chief Complaint  Patient presents with   Fall     Brief history of present illness from the day of admission and additional interim summary    88 y.o. female, with history of hypertension, hypertensive and diabetic retinopathy, DM type 2 insulin-dependent, previous history of osteoporosis, dyslipidemia, vitamin B12 deficiency who lives with her daughter at home with is quite functional, according to the daughter patient has been getting gradually weak for the last few days, last Friday evening she went to the bathroom and when she was trying to get up her right knee gave up and she fell, she hit her right chest on the side of the boat and then bathroom floor, she was brought to an outlying ER where she was diagnosed with right-sided rib fractures along with dehydration and hypercalcemia.  She was subsequently brought to [facility] for further care.                                                                 Hospital Course    1.  Hypercalcemia induced dehydration, generalized weakness and mechanical fall  causing right-sided rib cage fracture.   Will be admitted to the hospital, PCP to kindly follow-up pending hypercalcemia work-up which includes PTH, PTH RP, ACE levels, vitamin D levels, TSH, she was treated with IV fluids, calcitonin nasal spray with improvement in her calcium levels, she was taking oral calcium supplement and this could have caused her acute on chronic hypercalcemia, of note patient's calcium has been elevated for the past few checks in the system.  Case was discussed with patient's PCP, her calcium level is back to her chronic levels and she is symptom-free will be discharged home without her oral calcium supplementation.   2.  Mechanical fall with 3 right-sided rib cage fractures.  Due to #1 above, plan as in above, PT OT, and by trauma service, home PT along with I-S at home.  Tylenol for pain control.  She is relatively symptom-free.   3.  Essential hypertension.  Continue home medications which will include  Norvasc twice daily, AKI ARB stopped, Catapres dose adjusted, blood pressure stable PCP to monitor.  Imdur and other blood pressure medications continued.   4.  Dehydration with AKI.  As in #1 above, renal function improved after IV fluids and holding of ARB, will discontinue ARB for now on discharge.  Can be resumed later by PCP if renal function remains stable.   5.  Arthritis of the right knee.  Supportive care.   6.  Dyslipidemia.  Continue home dose statin.   7.  Possible age-related cognitive decline.  Mild metabolic encephalopathy at night, resolved.   8. DM type II.  Continue home regimen as renal function is back to normal.  Quested to check CBGs [facility]. [facility].  Daughter updated bedside agrees with the plan.  Lab Results  Component Value Date   HGBA1C 6.6 (H) 06/28/2022   Lab Results  Component Value Date   TSH 2.430 06/28/2022      Discharge diagnosis     Principal Problem:   Hypercalcemia Active Problems:   Syncope   Diabetes mellitus due to  underlying condition with hyperglycemia, without long-term current use of insulin (HCC)   Essential hypertension   Multiple fractures of ribs, right side, initial encounter for closed fracture   Fall at home, initial encounter   Rib fracture    Discharge instructions    Discharge Instructions     Discharge instructions   Complete by: As directed    You have rib fractures of the sixth, seventh and eighth ribs on the right.  Your knee x-ray revealed osteoarthritis.  Follow with Primary MD Orpah CobbKadakia, Ajay, MD in 7 days, use incentive spirometer every 1 hour while awake, sit up in chair as much as possible.  Take the incentive spirometer with you home.  Get CBC, CMP, 2 view Chest X ray -  checked next visit within 1 week by Primary MD    Activity: As tolerated with Full fall precautions use walker/cane & assistance as needed  Disposition Home    Diet: Heart Healthy Low Carb, check CBGs  Special Instructions: If you have smoked or chewed Tobacco  in the last 2 yrs please stop smoking, stop any regular Alcohol  and or any Recreational drug use.  On your next visit with your primary care physician please Get Medicines reviewed and adjusted.  Please request your Prim.MD to go over all Hospital Tests and Procedure/Radiological results at the follow up, please get all Hospital records sent to your Prim MD by signing hospital release before you go home.  If you experience worsening of your admission symptoms, develop shortness of breath, life threatening emergency, suicidal or homicidal thoughts you must seek medical attention immediately by calling 911 or calling your MD immediately  if symptoms less severe.  You Must read complete instructions/literature along with all the possible adverse reactions/side effects for all the Medicines you take and that have been prescribed to you. Take any new Medicines after you have completely understood and accpet all the possible adverse reactions/side  effects.   Increase activity slowly   Complete by: As directed        Discharge Medications   Allergies as of 06/29/2022   No Known Allergies      Medication List     STOP taking these medications    CALCIUM + D3 PO   doxycycline 100 MG capsule Commonly known as: MONODOX   insulin aspart 100 UNIT/ML injection Commonly known as: novoLOG   losartan 100  MG tablet Commonly known as: COZAAR   Maxitrol 0.1 % Oint Generic drug: neomycin-polymyxin-dexameth   Nesina 25 MG Tabs Generic drug: Alogliptin Benzoate       TAKE these medications    acetaminophen 500 MG tablet Commonly known as: TYLENOL Take 1 tablet (500 mg total) by mouth every 8 (eight) hours as needed for moderate pain or mild pain.   amLODipine 5 MG tablet Commonly known as: NORVASC Take 5 mg by mouth 2 (two) times daily.   aspirin EC 81 MG tablet Take 81 mg by mouth in the morning. Swallow whole.   atorvastatin 10 MG tablet Commonly known as: LIPITOR Take 10 mg by mouth See admin instructions. Take 10 mg by mouth at bedtime on Mon/Wed/Fri only   cloNIDine 0.2 MG tablet Commonly known as: CATAPRES Take 1 tablet (0.2 mg total) by mouth 3 (three) times daily. What changed: when to take this   furosemide 20 MG tablet Commonly known as: LASIX Take 1 tablet (20 mg total) by mouth every Monday, Wednesday, and Friday. Start taking on: July 02, 2022 What changed: These instructions start on July 02, 2022. If you are unsure what to do until then, ask your doctor or other care provider.   glipiZIDE 5 MG 24 hr tablet Commonly known as: GLUCOTROL XL Take 1 tablet (5 mg total) by mouth daily with breakfast. What changed: how much to take   isosorbide mononitrate 60 MG 24 hr tablet Commonly known as: IMDUR Take 60 mg by mouth daily.   Januvia 100 MG tablet Generic drug: sitaGLIPtin Take 50 mg by mouth daily.   Jardiance 10 MG Tabs tablet Generic drug: empagliflozin Take 10 mg by mouth  daily.   metFORMIN 500 MG tablet Commonly known as: GLUCOPHAGE Take 500 mg by mouth 2 (two) times daily with a meal.   metoprolol tartrate 50 MG tablet Commonly known as: LOPRESSOR Take 50 mg by mouth in the morning.   potassium chloride 10 MEQ tablet Commonly known as: KLOR-CON M Take 1 tablet (10 mEq total) by mouth daily. Start taking on: July 02, 2022 What changed: These instructions start on July 02, 2022. If you are unsure what to do until then, ask your doctor or other care provider.   VITAMIN B 12 PO Take 1 tablet by mouth daily with breakfast.         Follow-up Information     [name], MD. Schedule an appointment as soon as possible for a visit in 1 week(s).   Specialty: Cardiology Contact information: [address] [place] [address] [phone]                 Major procedures and Radiology Reports - PLEASE review detailed and final reports thoroughly  -        CT Chest W Contrast  Result Date: 06/27/2022 CLINICAL DATA:  Fall, right-sided chest trauma EXAM: CT CHEST WITH CONTRAST TECHNIQUE: Multidetector CT imaging of the chest was performed during intravenous contrast administration. RADIATION DOSE REDUCTION: This exam was performed according to the departmental dose-optimization program which includes automated exposure control, adjustment of the mA and/or kV according to patient size and/or use of iterative reconstruction technique. CONTRAST:  56mL OMNIPAQUE IOHEXOL 300 MG/ML  SOLN COMPARISON:  None Available. FINDINGS: Cardiovascular: Aortic atherosclerosis. Cardiomegaly. Three-vessel coronary artery calcifications. Enlargement of the main pulmonary artery measuring up to 3.9 cm in caliber. No pericardial effusion. Mediastinum/Nodes: No enlarged mediastinal, hilar, or axillary lymph nodes. Small hiatal hernia. Thyroid gland,  trachea, and esophagus demonstrate no significant findings. Lungs/Pleura: Elevation of the right  hemidiaphragm, possibly related to splinting. Scarring and or atelectasis of the right lung base. No pleural effusion or pneumothorax. Upper Abdomen: No acute abnormality. Musculoskeletal: Mildly displaced fractures of the lateral right sixth, seventh, and eighth ribs (series 2, image 52 62, 72). IMPRESSION: 1. Mildly displaced fractures of the lateral right sixth, seventh, and eighth ribs. 2. No associated pneumothorax or pleural effusion. 3. Elevation of the right hemidiaphragm, possibly related to splinting. 4. Cardiomegaly and coronary artery disease. 5. Enlargement of the main pulmonary artery, as can be seen in pulmonary hypertension. Aortic Atherosclerosis (ICD10-I70.0). Electronically Signed   By: Jearld Lesch M.D.   On: 06/27/2022 19:18   DG Knee Complete 4 Views Right  Result Date: 06/27/2022 CLINICAL DATA:  Fall from toilet, right knee pain EXAM: RIGHT KNEE - COMPLETE 4+ VIEW COMPARISON:  None Available. FINDINGS: No evidence of fracture, dislocation, or joint effusion. Moderate tricompartmental joint space narrowing and osteophytosis. Vascular calcinosis. IMPRESSION: No fracture or dislocation of the right knee. Moderate tricompartmental osteoarthritis. Electronically Signed   By: Jearld Lesch M.D.   On: 06/27/2022 16:28   DG Ribs Unilateral W/Chest Right  Result Date: 06/27/2022 CLINICAL DATA:  Fall, right rib pain EXAM: RIGHT RIBS AND CHEST - 3+ VIEW COMPARISON:  06/30/2016 FINDINGS: Mildly displaced fractures of the lateral right sixth through eighth ribs. There is no evidence of pneumothorax. Probable trace bilateral pleural effusions. Both lungs are clear. Cardiomegaly. IMPRESSION: 1. Mildly displaced fractures of the lateral right sixth through eighth ribs. No pneumothorax. 2. Probable trace bilateral pleural effusions. 3. Cardiomegaly. Electronically Signed   By: Jearld Lesch M.D.   On: 06/27/2022 16:27    Micro Results    No results found for this or any previous visit (from the  past 240 hour(s)).  Today   Subjective    Michaela Herrera today has no headache,no chest abdominal pain,no new weakness tingling or numbness, feels much better wants to go home today.    Objective   Blood pressure 137/78, pulse 74, temperature 98 F (36.7 C), temperature source Oral, resp. rate 20, height 5\' 4"  (1.626 m), weight 57.5 kg, SpO2 98 %.   Intake/Output Summary (Last 24 hours) at 06/29/2022 0914 Last data filed at 06/29/2022 0100 Gross per 24 hour  Intake 544.6 ml  Output 400 ml  Net 144.6 ml    Exam  Awake Alert, No new F.N deficits,    [place]igg.AT,PERRAL Supple Neck,   Symmetrical Chest wall movement, Good air movement bilaterally, CTAB RRR,No Gallops,   +ve B.Sounds, Abd Soft, Non tender,  No Cyanosis, Clubbing or edema    Data Review   Recent Labs  Lab 06/27/22 1540 06/28/22 1843 06/29/22 0209  WBC 13.9* 15.7* 13.7*  HGB 14.6 13.6 12.5  HCT 44.4 41.5 37.4  PLT 300 262 231  MCV 87.6 86.6 86.4  MCH 28.8 28.4 28.9  MCHC 32.9 32.8 33.4  RDW 13.6 13.5 13.3  LYMPHSABS 2.9  --  3.7  MONOABS 0.9  --  0.9  EOSABS 0.3  --  0.4  BASOSABS 0.1  --  0.1    Recent Labs  Lab 06/27/22 1540 06/27/22 1841 06/28/22 1843 06/29/22 0209  NA 136  --   --  140  K 4.3  --   --  3.8  CL 102  --   --  111  CO2 26  --   --  21*  GLUCOSE 113*  --   --  116*  BUN 27*  --   --  19  CREATININE 1.35*  --  1.14* 1.06*  CALCIUM 13.5*  --   --  11.9*  AST 15  --   --  18  ALT 10  --   --  12  ALKPHOS 142*  --   --  102  BILITOT 0.6  --   --  0.7  ALBUMIN 4.8  --   --  3.2*  MG  --  1.9  --  1.7  PHOS  --  2.4*  --   --   TSH  --   --  2.430  --   HGBA1C  --   --  6.6*  --   AMMONIA  --  33  --   --   BNP  --   --   --  112.1*    Total Time in preparing paper work, data evaluation and todays exam - 35 minutes  Susa Raring M.D on 06/29/2022 at 9:14 AM  [facility]

## 2022-06-29 NOTE — Progress Notes (Signed)
Subjective: CC: Stable R rib pain that is well controlled with tylenol. No sob. On RA. No other complaints. Tolerating po. No n/v. Voiding. Up to chair this am after working with therapies.   Objective: Vital signs in last 24 hours: Temp:  [97.9 F (36.6 C)-99.2 F (37.3 C)] 98 F (36.7 C) (08/22 0800) Pulse Rate:  [58-74] 74 (08/21 2003) Resp:  [16-25] 20 (08/22 0400) BP: (120-170)/(53-79) 137/78 (08/22 0800) SpO2:  [95 %-99 %] 98 % (08/22 0400) Weight:  [57.5 kg] 57.5 kg (08/21 1558)    Intake/Output from previous day: 08/21 0701 - 08/22 0700 In: 544.6 [P.O.:480; IV Piggyback:64.6] Out: 400 [Urine:400] Intake/Output this shift: No intake/output data recorded.  PE: Gen:  Alert, NAD, pleasant Card:  Reg Pulm:  CTAB, no W/R/R, effort normal, on RA.  Abd: Soft, ND, NT  Ext:  No LE edema. MAE's. No ttp over major joints of BUE and BLE's  Lab Results:  Recent Labs    06/28/22 1843 06/29/22 0209  WBC 15.7* 13.7*  HGB 13.6 12.5  HCT 41.5 37.4  PLT 262 231   BMET Recent Labs    06/27/22 1540 06/28/22 1843 06/29/22 0209  NA 136  --  140  K 4.3  --  3.8  CL 102  --  111  CO2 26  --  21*  GLUCOSE 113*  --  116*  BUN 27*  --  19  CREATININE 1.35* 1.14* 1.06*  CALCIUM 13.5*  --  11.9*   PT/INR No results for input(s): "LABPROT", "INR" in the last 72 hours. CMP     Component Value Date/Time   NA 140 06/29/2022 0209   K 3.8 06/29/2022 0209   CL 111 06/29/2022 0209   CO2 21 (L) 06/29/2022 0209   GLUCOSE 116 (H) 06/29/2022 0209   BUN 19 06/29/2022 0209   CREATININE 1.06 (H) 06/29/2022 0209   CALCIUM 11.9 (H) 06/29/2022 0209   PROT 6.0 (L) 06/29/2022 0209   ALBUMIN 3.2 (L) 06/29/2022 0209   AST 18 06/29/2022 0209   ALT 12 06/29/2022 0209   ALKPHOS 102 06/29/2022 0209   BILITOT 0.7 06/29/2022 0209   GFRNONAA 49 (L) 06/29/2022 0209   GFRAA 56 (L) 01/23/2016 1013   Lipase  No results found for: "LIPASE"  Studies/Results: CT Chest W  Contrast  Result Date: 06/27/2022 CLINICAL DATA:  Fall, right-sided chest trauma EXAM: CT CHEST WITH CONTRAST TECHNIQUE: Multidetector CT imaging of the chest was performed during intravenous contrast administration. RADIATION DOSE REDUCTION: This exam was performed according to the departmental dose-optimization program which includes automated exposure control, adjustment of the mA and/or kV according to patient size and/or use of iterative reconstruction technique. CONTRAST:  60mL OMNIPAQUE IOHEXOL 300 MG/ML  SOLN COMPARISON:  None Available. FINDINGS: Cardiovascular: Aortic atherosclerosis. Cardiomegaly. Three-vessel coronary artery calcifications. Enlargement of the main pulmonary artery measuring up to 3.9 cm in caliber. No pericardial effusion. Mediastinum/Nodes: No enlarged mediastinal, hilar, or axillary lymph nodes. Small hiatal hernia. Thyroid gland, trachea, and esophagus demonstrate no significant findings. Lungs/Pleura: Elevation of the right hemidiaphragm, possibly related to splinting. Scarring and or atelectasis of the right lung base. No pleural effusion or pneumothorax. Upper Abdomen: No acute abnormality. Musculoskeletal: Mildly displaced fractures of the lateral right sixth, seventh, and eighth ribs (series 2, image 52 62, 72). IMPRESSION: 1. Mildly displaced fractures of the lateral right sixth, seventh, and eighth ribs. 2. No associated pneumothorax or pleural effusion. 3. Elevation of the right hemidiaphragm,  possibly related to splinting. 4. Cardiomegaly and coronary artery disease. 5. Enlargement of the main pulmonary artery, as can be seen in pulmonary hypertension. Aortic Atherosclerosis (ICD10-I70.0). Electronically Signed   By: Jearld Lesch M.D.   On: 06/27/2022 19:18   DG Knee Complete 4 Views Right  Result Date: 06/27/2022 CLINICAL DATA:  Fall from toilet, right knee pain EXAM: RIGHT KNEE - COMPLETE 4+ VIEW COMPARISON:  None Available. FINDINGS: No evidence of fracture,  dislocation, or joint effusion. Moderate tricompartmental joint space narrowing and osteophytosis. Vascular calcinosis. IMPRESSION: No fracture or dislocation of the right knee. Moderate tricompartmental osteoarthritis. Electronically Signed   By: Jearld Lesch M.D.   On: 06/27/2022 16:28   DG Ribs Unilateral W/Chest Right  Result Date: 06/27/2022 CLINICAL DATA:  Fall, right rib pain EXAM: RIGHT RIBS AND CHEST - 3+ VIEW COMPARISON:  06/30/2016 FINDINGS: Mildly displaced fractures of the lateral right sixth through eighth ribs. There is no evidence of pneumothorax. Probable trace bilateral pleural effusions. Both lungs are clear. Cardiomegaly. IMPRESSION: 1. Mildly displaced fractures of the lateral right sixth through eighth ribs. No pneumothorax. 2. Probable trace bilateral pleural effusions. 3. Cardiomegaly. Electronically Signed   By: Jearld Lesch M.D.   On: 06/27/2022 16:27    Anti-infectives: Anti-infectives (From admission, onward)    None        Assessment/Plan Fall Right 6-8 rib fx, mildly displaced- No PTX on CT. She is very comfortable with po meds. Pulm toilet (will ask RN to get IS). Therapy evals.   Right knee pain- negative xrays  Remainder of care for chronic medical conditions, elevated Cr, elevated Ca etc per [facility]. We will sign off. Please call back with questions or concerns.     LOS: 0 days    Jacinto Halim , [facility] 06/29/2022, 9:44 AM Please see Amion for pager number during day hours 7:00am-4:30pm

## 2022-06-29 NOTE — Evaluation (Signed)
Physical Therapy Evaluation Patient Details Name: Michaela Herrera MRN: [id number] DOB: 26-Apr-1928 Today's Date: 06/29/2022  History of Present Illness  Pt is a 88 y/o female who presented after a mechanical fall at home. Pt found to have R rib fx's on right side (6-8). Pt also admitted due to hypercalcemia and dehydration. PMH includes HTN, diabetic retinopathy, DM2, and osteoporosis  Clinical Impression  Pt agreeable to physical therapy evaluation. Pt is a poor historian due to impaired cognition and being hard of hearing. Pt able to ambulate x 90 feet with RW and CGA. Pt limited by vertigo onset; vitals signs stable and no nystagmus. Pt currently presents with functional limitations secondary to impairments listed in PT problem list. Pt to benefit from skilled, acute care physical therapy interventions to maximize her independence level and quality of life. Progress to stair training if pt not yet discharged.    Recommendations for follow up therapy are one component of a multi-disciplinary discharge planning process, led by the attending physician.  Recommendations may be updated based on patient status, additional functional criteria and insurance authorization.  Follow Up Recommendations Home health PT      Assistance Recommended at Discharge PRN  Patient can return home with the following  A little help with walking and/or transfers;A little help with bathing/dressing/bathroom;Assistance with cooking/housework;Direct supervision/assist for medications management;Direct supervision/assist for financial management;Assist for transportation;Help with stairs or ramp for entrance    Equipment Recommendations None recommended by PT (pending verification of pt's reported DME)  Recommendations for Other Services       Functional Status Assessment Patient has had a recent decline in their functional status and demonstrates the ability to make significant improvements in function in a reasonable  and predictable amount of time.     Precautions / Restrictions Precautions Precautions: Fall Restrictions Weight Bearing Restrictions: No      Mobility  Bed Mobility               General bed mobility comments: Supervision during OT eval. Pt up in chair upon arrival.    Transfers Overall transfer level: Needs assistance Equipment used: Rolling walker (2 wheels) Transfers: Sit to/from Stand, Bed to chair/wheelchair/BSC Sit to Stand: Min guard           General transfer comment: CGA for safety. Pt with no instability.    Ambulation/Gait Ambulation/Gait assistance: Min guard Gait Distance (Feet): 90 Feet Assistive device: Rolling walker (2 wheels) Gait Pattern/deviations: Step-through pattern Gait velocity: decreased Gait velocity interpretation: <1.31 ft/sec, indicative of household ambulator   General Gait Details: Pt with no LOB. Pt c/o head spinning while out in hallway and went back to room thereafter. Vital signs stable.  Stairs            Wheelchair Mobility    Modified Rankin (Stroke Patients Only)       Balance Overall balance assessment: Needs assistance, History of Falls Sitting-balance support: Feet supported, No upper extremity supported Sitting balance-Leahy Scale: Fair     Standing balance support: Bilateral upper extremity supported, During functional activity Standing balance-Leahy Scale: Poor                               Pertinent Vitals/Pain Pain Assessment Pain Assessment: 0-10 Pain Score: 7  Pain Location: R ribs Pain Descriptors / Indicators: Sore (no grimacing or guarding during session) Pain Intervention(s): Monitored during session, Limited activity within patient's tolerance    Home Living  Family/patient expects to be discharged to:: Private residence Living Arrangements: Children Available Help at Discharge: Family Type of Home: House Home Access: Stairs to enter Entrance Stairs-Rails:  Left;Right;Can reach both Entrance Stairs-Number of Steps: reports 3 steps but gave several answers   Home Layout: One level Home Equipment: Agricultural consultant (2 wheels);Shower seat;BSC/3in1;Wheelchair - manual;Cane - single point Additional Comments: pt reports access to assistance essentially 24/7 between daughter and son. Pt unreliable historian.    Prior Function Prior Level of Function : Needs assist             Mobility Comments: reports typically not using AD for mobility, endorses only needing RW after recent fall - unsure of accuracy ADLs Comments: Pt reports able to dress, toilet self, daughter provides assist with shower transfers and supervision for showering tasks. Pt does not drive. DTR also takes care of household chores     Hand Dominance   Dominant Hand: Right    Extremity/Trunk Assessment   Upper Extremity Assessment Upper Extremity Assessment: Defer to OT evaluation    Lower Extremity Assessment Lower Extremity Assessment:  (Grossly 4-/5 major muscle groups B LE's)    Cervical / Trunk Assessment Cervical / Trunk Assessment: Other exceptions Cervical / Trunk Exceptions: R rib fxs  Communication   Communication: HOH  Cognition Arousal/Alertness: Awake/alert Behavior During Therapy: WFL for tasks assessed/performed Overall Cognitive Status: History of cognitive impairments - at baseline                                 General Comments: Pt follows simple commands appropriately. Pt A and O x 4 but has difficulty answering questions 2/2 being HOH and due to intermittent confusion.        General Comments General comments (skin integrity, edema, etc.): HR 78, SpO2 97%, and BP 143/63 on RA    Exercises     Assessment/Plan    PT Assessment Patient needs continued PT services  PT Problem List Decreased strength;Pain;Decreased cognition;Decreased mobility;Decreased balance;Decreased activity tolerance;Decreased knowledge of use of DME        PT Treatment Interventions DME instruction;Gait training;Stair training;Functional mobility training;Therapeutic exercise;Therapeutic activities;Balance training;Neuromuscular re-education;Cognitive remediation;Patient/family education;Modalities    PT Goals (Current goals can be found in the Care Plan section)  Acute Rehab PT Goals Patient Stated Goal: did not state PT Goal Formulation: With patient Time For Goal Achievement: 07/07/22 Potential to Achieve Goals: Good    Frequency Min 3X/week     Co-evaluation               AM-PAC PT "6 Clicks" Mobility  Outcome Measure Help needed turning from your back to your side while in a flat bed without using bedrails?: A Little Help needed moving from lying on your back to sitting on the side of a flat bed without using bedrails?: A Little Help needed moving to and from a bed to a chair (including a wheelchair)?: A Little Help needed standing up from a chair using your arms (e.g., wheelchair or bedside chair)?: A Little Help needed to walk in hospital room?: A Little Help needed climbing 3-5 steps with a railing? : A Little 6 Click Score: 18    End of Session Equipment Utilized During Treatment: Gait belt Activity Tolerance: Patient tolerated treatment well Patient left: in chair;with call bell/phone within reach;with chair alarm set Nurse Communication: Mobility status PT Visit Diagnosis: Other abnormalities of gait and mobility (R26.89);Muscle weakness (generalized) (M62.81);Pain  Pain - Right/Left: Right Pain - part of body:  (ribs)    Time: 6387-5643 PT Time Calculation (min) (ACUTE ONLY): 20 min   Charges:   PT Evaluation $PT Eval Low Complexity: 1 Low          Tana Coast, PT   [employer] 06/29/2022, 9:28 AM

## 2022-06-29 NOTE — Evaluation (Signed)
Occupational Therapy Evaluation Patient Details Name: Michaela Herrera MRN: [id number] DOB: 1928-07-06 Today's Date: 06/29/2022   History of Present Illness Pt is a 88 y/o female who presented after a mechanical fall at home. Pt found to have R rib fx. PMH: HTN, diabetic retinopathy, DM2, osteoporosis, dementia   Clinical Impression   PTA, pt lives with family, reports typically Independent with most ADLs and mobility without AD. Family not present to confirm home setup/PLOF reports. Pt reports now with R rib pain and minor deficits in balance, strength, endurance and cognition. Pt initially with posterior LOB in standing requiring Min A to correct though able to progress to min guard for mobility using RW. Pt requires Setup for UB ADL and Min guard for LB ADLs. Will follow acutely to progress safety/fall prevention with daily routine. Pt appears fairly close to reported baseline and if family able to provide consistent supervision/support at DC, anticipate able to DC without OT follow-up.       Recommendations for follow up therapy are one component of a multi-disciplinary discharge planning process, led by the attending physician.  Recommendations may be updated based on patient status, additional functional criteria and insurance authorization.   Follow Up Recommendations  No OT follow up    Assistance Recommended at Discharge Intermittent Supervision/Assistance  Patient can return home with the following A little help with walking and/or transfers;A little help with bathing/dressing/bathroom;Assistance with cooking/housework;Help with stairs or ramp for entrance    Functional Status Assessment  Patient has had a recent decline in their functional status and demonstrates the ability to make significant improvements in function in a reasonable and predictable amount of time.  Equipment Recommendations  Other (comment) (RW if does not already have)    Recommendations for Other Services        Precautions / Restrictions Precautions Precautions: Fall Restrictions Weight Bearing Restrictions: No      Mobility Bed Mobility Overal bed mobility: Needs Assistance Bed Mobility: Supine to Sit     Supine to sit: Supervision, HOB elevated     General bed mobility comments: increased time to L side    Transfers Overall transfer level: Needs assistance Equipment used: Rolling walker (2 wheels) Transfers: Sit to/from Stand, Bed to chair/wheelchair/BSC Sit to Stand: Min assist     Step pivot transfers: Min guard     General transfer comment: initially Min A to stand with LOB posteriorly back onto bed. improved with second sit to stand and able to step to recliner      Balance Overall balance assessment: Needs assistance, History of Falls Sitting-balance support: Feet supported, No upper extremity supported Sitting balance-Leahy Scale: Fair     Standing balance support: Bilateral upper extremity supported, During functional activity Standing balance-Leahy Scale: Poor                             ADL either performed or assessed with clinical judgement   ADL Overall ADL's : Needs assistance/impaired Eating/Feeding: Independent   Grooming: Set up;Sitting;Wash/dry face;Applying deodorant   Upper Body Bathing: Set up;Sitting   Lower Body Bathing: Min guard;Sit to/from stand   Upper Body Dressing : Set up;Sitting   Lower Body Dressing: Min guard;Sit to/from stand Lower Body Dressing Details (indicate cue type and reason): able to don socks with figure four position and increased time, some interference from rib pain Toilet Transfer: Minimal assistance;Ambulation;BSC/3in1;Rolling walker (2 wheels)   Toileting- Clothing Manipulation and Hygiene: Min guard;Sitting/lateral  lean;Sit to/from stand       Functional mobility during ADLs: Min guard;Rolling walker (2 wheels) General ADL Comments: Pt with initial difficulties gaining balance though  reports this is typical for mornings. able to complete somoe bathing tasks sitting EOB per request     Vision Ability to See in Adequate Light: 0 Adequate Patient Visual Report: No change from baseline Vision Assessment?: No apparent visual deficits     Perception     Praxis      Pertinent Vitals/Pain Pain Assessment Pain Assessment: Faces Faces Pain Scale: Hurts little more Pain Location: R ribs Pain Descriptors / Indicators: Grimacing, Sore Pain Intervention(s): Monitored during session     Hand Dominance Right   Extremity/Trunk Assessment Upper Extremity Assessment Upper Extremity Assessment: Generalized weakness   Lower Extremity Assessment Lower Extremity Assessment: Defer to PT evaluation   Cervical / Trunk Assessment Cervical / Trunk Assessment: Other exceptions Cervical / Trunk Exceptions: R rib fx   Communication Communication Communication: HOH   Cognition Arousal/Alertness: Awake/alert Behavior During Therapy: WFL for tasks assessed/performed Overall Cognitive Status: History of cognitive impairments - at baseline                                 General Comments: hx of dementia though pleasant, follows directions well, shows decent insight though some difficulty answering DME/home setup questions accurately with some inconsistencies reported to OT and then PT     General Comments  VSS on RA    Exercises     Shoulder Instructions      Home Living Family/patient expects to be discharged to:: Private residence Living Arrangements: Children Available Help at Discharge: Family Type of Home: House Home Access: Stairs to enter [employer] of Steps: "just a few" - unable to give a number   Home Layout: One level     Bathroom Shower/Tub: Chief Strategy Officer: Standard     Home Equipment: Agricultural consultant (2 wheels);Shower seat;BSC/3in1   Additional Comments: pt reports access to assistance essentially 24/7  between daughter and son      Prior Functioning/Environment Prior Level of Function : Needs assist             Mobility Comments: reports typically not using AD for mobility, endorses only this one recent fall - unsure of accuracy ADLs Comments: Pt reports able to dress, toilet self, daughter provides assist with shower transfers and supervision for showering tasks        OT Problem List: Decreased activity tolerance;Impaired balance (sitting and/or standing);Decreased strength;Decreased knowledge of use of DME or AE;Pain      OT Treatment/Interventions: Self-care/ADL training;Therapeutic exercise;Energy conservation;DME and/or AE instruction;Therapeutic activities;Patient/family education;Balance training    OT Goals(Current goals can be found in the care plan section) Acute Rehab OT Goals Patient Stated Goal: pain control, daughter to bring some clothes, go home soon OT Goal Formulation: With patient Time For Goal Achievement: 07/13/22 Potential to Achieve Goals: Good  OT Frequency: Min 2X/week    Co-evaluation              AM-PAC OT "6 Clicks" Daily Activity     Outcome Measure Help from another person eating meals?: None Help from another person taking care of personal grooming?: A Little Help from another person toileting, which includes using toliet, bedpan, or urinal?: A Little Help from another person bathing (including washing, rinsing, drying)?: A Little Help from another person to  put on and taking off regular upper body clothing?: A Little Help from another person to put on and taking off regular lower body clothing?: A Little 6 Click Score: 19   End of Session Equipment Utilized During Treatment: Rolling walker (2 wheels) Nurse Communication: Mobility status  Activity Tolerance: Patient tolerated treatment well Patient left: in chair;with call bell/phone within reach;with chair alarm set  OT Visit Diagnosis: Unsteadiness on feet (R26.81) Pain -  Right/Left: Right Pain - part of body:  (ribs)                Time: 1610-9604 OT Time Calculation (min): 28 min Charges:  OT General Charges $OT Visit: 1 Visit OT Evaluation $OT Eval Low Complexity: 1 Low OT Treatments $Self Care/Home Management : 8-22 mins  Bradd Canary, OTR/L Acute Rehab Services Office: [phone]   Lorre Munroe 06/29/2022, 8:57 AM

## 2022-06-29 NOTE — Plan of Care (Signed)
  Problem: Education: Goal: Knowledge of General Education information will improve Description: Including pain rating scale, medication(s)/side effects and non-pharmacologic comfort measures Outcome: Not Progressing   Problem: Health Behavior/Discharge Planning: Goal: Ability to manage health-related needs will improve Outcome: Not Progressing   Problem: Clinical Measurements: Goal: Ability to maintain clinical measurements within normal limits will improve Outcome: Progressing Goal: Will remain free from infection Outcome: Progressing Goal: Diagnostic test results will improve Outcome: Progressing Goal: Respiratory complications will improve Outcome: Progressing Goal: Cardiovascular complication will be avoided Outcome: Progressing   Problem: Activity: Goal: Risk for activity intolerance will decrease Outcome: Progressing   Problem: Nutrition: Goal: Adequate nutrition will be maintained Outcome: Progressing   Problem: Coping: Goal: Level of anxiety will decrease Outcome: Progressing   Problem: Elimination: Goal: Will not experience complications related to bowel motility Outcome: Progressing Goal: Will not experience complications related to urinary retention Outcome: Progressing   Problem: Pain Managment: Goal: General experience of comfort will improve Outcome: Progressing   Problem: Safety: Goal: Ability to remain free from injury will improve Outcome: Progressing   Problem: Skin Integrity: Goal: Risk for impaired skin integrity will decrease Outcome: Progressing   Problem: Education: Goal: Ability to describe self-care measures that may prevent or decrease complications (Diabetes Survival Skills Education) will improve Outcome: Progressing Goal: Individualized Educational Video(s) Outcome: Progressing   Problem: Health Behavior/Discharge Planning: Goal: Ability to identify and utilize available resources and services will improve Outcome:  Progressing Goal: Ability to manage health-related needs will improve Outcome: Progressing   Problem: Metabolic: Goal: Ability to maintain appropriate glucose levels will improve Outcome: Progressing   Problem: Nutritional: Goal: Maintenance of adequate nutrition will improve Outcome: Progressing Goal: Progress toward achieving an optimal weight will improve Outcome: Progressing   Problem: Skin Integrity: Goal: Risk for impaired skin integrity will decrease Outcome: Progressing   Problem: Tissue Perfusion: Goal: Adequacy of tissue perfusion will improve Outcome: Progressing

## 2022-07-03 LAB — PTH-RELATED PEPTIDE: PTH-related peptide: <2 pmol/L

## 2022-11-17 ENCOUNTER — Emergency Department (HOSPITAL_COMMUNITY): Payer: Medicare HMO

## 2022-11-17 ENCOUNTER — Inpatient Hospital Stay (HOSPITAL_COMMUNITY)
Admission: EM | Admit: 2022-11-17 | Discharge: 2022-11-23 | DRG: 521 | Disposition: A | Discharge status: Skilled Nursing Facility | Payer: Medicare HMO | Attending: Internal Medicine | Admitting: Internal Medicine

## 2022-11-17 ENCOUNTER — Encounter (HOSPITAL_COMMUNITY): Payer: Self-pay | Admitting: Emergency Medicine

## 2022-11-17 DIAGNOSIS — E43 Unspecified severe protein-calorie malnutrition: Secondary | ICD-10-CM | POA: Insufficient documentation

## 2022-11-17 DIAGNOSIS — I5030 Unspecified diastolic (congestive) heart failure: Secondary | ICD-10-CM | POA: Diagnosis not present

## 2022-11-17 DIAGNOSIS — Z7982 Long term (current) use of aspirin: Secondary | ICD-10-CM

## 2022-11-17 DIAGNOSIS — E119 Type 2 diabetes mellitus without complications: Secondary | ICD-10-CM | POA: Diagnosis not present

## 2022-11-17 DIAGNOSIS — J9601 Acute respiratory failure with hypoxia: Secondary | ICD-10-CM | POA: Diagnosis not present

## 2022-11-17 DIAGNOSIS — Z7984 Long term (current) use of oral hypoglycemic drugs: Secondary | ICD-10-CM | POA: Diagnosis not present

## 2022-11-17 DIAGNOSIS — Z6820 Body mass index (BMI) 20.0-20.9, adult: Secondary | ICD-10-CM

## 2022-11-17 DIAGNOSIS — R296 Repeated falls: Secondary | ICD-10-CM | POA: Diagnosis present

## 2022-11-17 DIAGNOSIS — K2980 Duodenitis without bleeding: Secondary | ICD-10-CM | POA: Diagnosis not present

## 2022-11-17 DIAGNOSIS — D72829 Elevated white blood cell count, unspecified: Secondary | ICD-10-CM | POA: Diagnosis present

## 2022-11-17 DIAGNOSIS — I110 Hypertensive heart disease with heart failure: Secondary | ICD-10-CM | POA: Diagnosis present

## 2022-11-17 DIAGNOSIS — Z66 Do not resuscitate: Secondary | ICD-10-CM | POA: Diagnosis present

## 2022-11-17 DIAGNOSIS — I5032 Chronic diastolic (congestive) heart failure: Secondary | ICD-10-CM | POA: Diagnosis present

## 2022-11-17 DIAGNOSIS — W19XXXA Unspecified fall, initial encounter: Secondary | ICD-10-CM | POA: Diagnosis not present

## 2022-11-17 DIAGNOSIS — F03A11 Unspecified dementia, mild, with agitation: Secondary | ICD-10-CM | POA: Diagnosis present

## 2022-11-17 DIAGNOSIS — W1830XA Fall on same level, unspecified, initial encounter: Secondary | ICD-10-CM | POA: Diagnosis present

## 2022-11-17 DIAGNOSIS — E113293 Type 2 diabetes mellitus with mild nonproliferative diabetic retinopathy without macular edema, bilateral: Secondary | ICD-10-CM | POA: Diagnosis present

## 2022-11-17 DIAGNOSIS — R0902 Hypoxemia: Secondary | ICD-10-CM | POA: Diagnosis present

## 2022-11-17 DIAGNOSIS — Z79899 Other long term (current) drug therapy: Secondary | ICD-10-CM

## 2022-11-17 DIAGNOSIS — S72002A Fracture of unspecified part of neck of left femur, initial encounter for closed fracture: Secondary | ICD-10-CM | POA: Diagnosis present

## 2022-11-17 DIAGNOSIS — Z9181 History of falling: Secondary | ICD-10-CM | POA: Diagnosis not present

## 2022-11-17 DIAGNOSIS — I10 Essential (primary) hypertension: Secondary | ICD-10-CM | POA: Diagnosis not present

## 2022-11-17 DIAGNOSIS — K219 Gastro-esophageal reflux disease without esophagitis: Secondary | ICD-10-CM | POA: Diagnosis present

## 2022-11-17 DIAGNOSIS — K2970 Gastritis, unspecified, without bleeding: Secondary | ICD-10-CM | POA: Diagnosis not present

## 2022-11-17 DIAGNOSIS — S72012A Unspecified intracapsular fracture of left femur, initial encounter for closed fracture: Principal | ICD-10-CM | POA: Diagnosis present

## 2022-11-17 LAB — CBC WITH DIFFERENTIAL/PLATELET
Abs Immature Granulocytes: 0.04 10*3/uL (ref 0.00–0.07)
Basophils Absolute: 0.1 10*3/uL (ref 0.0–0.1)
Basophils Relative: 1 %
Eosinophils Absolute: 0.3 10*3/uL (ref 0.0–0.5)
Eosinophils Relative: 2 %
HCT: 44.5 % (ref 36.0–46.0)
Hemoglobin: 14.3 g/dL (ref 12.0–15.0)
Immature Granulocytes: 0 %
Lymphocytes Relative: 20 %
Lymphs Abs: 2.8 10*3/uL (ref 0.7–4.0)
MCH: 28.7 pg (ref 26.0–34.0)
MCHC: 32.1 g/dL (ref 30.0–36.0)
MCV: 89.4 fL (ref 80.0–100.0)
Monocytes Absolute: 0.6 10*3/uL (ref 0.1–1.0)
Monocytes Relative: 5 %
Neutro Abs: 10.4 10*3/uL — ABNORMAL HIGH (ref 1.7–7.7)
Neutrophils Relative %: 72 %
Platelets: 237 10*3/uL (ref 150–400)
RBC: 4.98 MIL/uL (ref 3.87–5.11)
RDW: 13.7 % (ref 11.5–15.5)
WBC: 14.2 10*3/uL — ABNORMAL HIGH (ref 4.0–10.5)
nRBC: 0 % (ref 0.0–0.2)

## 2022-11-17 MED ORDER — ACETAMINOPHEN 325 MG PO TABS: 650.0000 mg | ORAL_TABLET | Freq: Four times a day (QID) | ORAL | Status: DC | PRN

## 2022-11-17 MED ORDER — NALOXONE HCL 0.4 MG/ML IJ SOLN: 0.4000 mg | INTRAMUSCULAR | Status: DC | PRN

## 2022-11-17 MED ORDER — ONDANSETRON HCL 4 MG/2ML IJ SOLN: 4.0000 mg | Freq: Four times a day (QID) | INTRAMUSCULAR | Status: DC | PRN

## 2022-11-17 MED ORDER — FENTANYL CITRATE PF 50 MCG/ML IJ SOSY
12.5000 ug | PREFILLED_SYRINGE | INTRAMUSCULAR | Status: DC | PRN
  Administered 2022-11-18: 12.5 ug via INTRAVENOUS
  Filled 2022-11-17: qty 1

## 2022-11-17 MED ORDER — ACETAMINOPHEN 650 MG RE SUPP: 650.0000 mg | Freq: Four times a day (QID) | RECTAL | Status: DC | PRN

## 2022-11-17 NOTE — ED Provider Notes (Signed)
[facility] DEPT Provider Note   CSN: [id number] Arrival date & time: 11/17/22  2119     History  Chief Complaint  Patient presents with   Lytle Michaels    EMONY DORMER is a 89 y.o. female.  Patient brought in by EMS from home.  Patient lost her balance walking down a narrow carpeted hallway with her walker.  Then she fell she is got left hip pain left leg is shortened and rotated.  EMS gave her 100 mcg of fentanyl.  She did not hit her head.  Her son was at home at that time.  Denies any neck pain or back pain upper extremity pain no right lower extremity pain.  Patient's past medical history is significant for the fact that she is not on blood thinners.  It is significant for hypertension diabetes gastroesophageal reflux disease and hypertensive retinopathy.  Patient does not use tobacco products.       Home Medications Prior to Admission medications   Medication Sig Start Date End Date Taking? Authorizing Provider  acetaminophen (TYLENOL) 500 MG tablet Take 1 tablet (500 mg total) by mouth every 8 (eight) hours as needed for moderate pain or mild pain. 06/29/22 06/29/23  Thurnell Lose, MD  amLODipine (NORVASC) 5 MG tablet Take 5 mg by mouth 2 (two) times daily. 12/11/15   [provider]  aspirin EC 81 MG tablet Take 81 mg by mouth in the morning. Swallow whole.    [provider]  atorvastatin (LIPITOR) 10 MG tablet Take 10 mg by mouth See admin instructions. Take 10 mg by mouth at bedtime on Mon/Wed/Fri only 07/07/20   [provider]  cloNIDine (CATAPRES) 0.2 MG tablet Take 1 tablet (0.2 mg total) by mouth 3 (three) times daily. 06/29/22   Thurnell Lose, MD  Cyanocobalamin (VITAMIN B 12 PO) Take 1 tablet by mouth daily with breakfast.    [provider]  furosemide (LASIX) 20 MG tablet Take 1 tablet (20 mg total) by mouth every Monday, Wednesday, and Friday. 07/02/22   Thurnell Lose, MD  glipiZIDE (GLUCOTROL XL)  5 MG 24 hr tablet Take 1 tablet (5 mg total) by mouth daily with breakfast. Patient taking differently: Take 2.5 mg by mouth daily with breakfast. 01/23/16   Dixie Dials, MD  isosorbide mononitrate (IMDUR) 60 MG 24 hr tablet Take 60 mg by mouth daily. 12/04/15   [provider]  JANUVIA 100 MG tablet Take 50 mg by mouth daily. 10/07/20   [provider]  JARDIANCE 10 MG TABS tablet Take 10 mg by mouth daily. 06/02/22   [provider]  metFORMIN (GLUCOPHAGE) 500 MG tablet Take 500 mg by mouth 2 (two) times daily with a meal.    [provider]  metoprolol (LOPRESSOR) 50 MG tablet Take 50 mg by mouth in the morning. 12/04/15   [provider]  potassium chloride (KLOR-CON M) 10 MEQ tablet Take 1 tablet (10 mEq total) by mouth daily. 07/02/22   Thurnell Lose, MD      Allergies    Patient has no known allergies.    Review of Systems   Review of Systems  Constitutional:  Negative for chills and fever.  HENT:  Negative for ear pain and sore throat.   Eyes:  Positive for visual disturbance. Negative for pain.  Respiratory:  Negative for cough and shortness of breath.   Cardiovascular:  Negative for chest pain and palpitations.  Gastrointestinal:  Negative for  abdominal pain and vomiting.  Genitourinary:  Negative for dysuria and hematuria.  Musculoskeletal:  Negative for arthralgias and back pain.  Skin:  Negative for color change and rash.  Neurological:  Negative for seizures and syncope.  All other systems reviewed and are negative.   Physical Exam Updated Vital Signs BP (!) 142/64   Pulse 78   Temp 97.9 F (36.6 C) (Oral)   Resp (!) 21   SpO2 90%  Physical Exam Vitals and nursing note reviewed.  Constitutional:      General: She is not in acute distress.    Appearance: Normal appearance. She is well-developed.  HENT:     Head: Normocephalic and atraumatic.     Mouth/Throat:     Mouth: Mucous membranes are moist.  Eyes:      Extraocular Movements: Extraocular movements intact.     Conjunctiva/sclera: Conjunctivae normal.     Pupils: Pupils are equal, round, and reactive to light.  Cardiovascular:     Rate and Rhythm: Normal rate and regular rhythm.     Heart sounds: No murmur heard. Pulmonary:     Effort: Pulmonary effort is normal. No respiratory distress.     Breath sounds: Normal breath sounds.  Abdominal:     Palpations: Abdomen is soft.     Tenderness: There is no abdominal tenderness.  Musculoskeletal:        General: Tenderness and deformity present. No swelling.     Cervical back: Normal range of motion and neck supple.     Comments: Left hip area.  Left leg is shortened and rotated.  Dorsalis pedis pulse to the left foot is 1+.  Good movement of toes.  Sensation intact.  Skin:    General: Skin is warm and dry.     Capillary Refill: Capillary refill takes less than 2 seconds.  Neurological:     General: No focal deficit present.     Mental Status: She is alert and oriented to person, place, and time.  Psychiatric:        Mood and Affect: Mood normal.     ED Results / Procedures / Treatments   Labs (all labs ordered are listed, but only abnormal results are displayed) Labs Reviewed  CBC WITH DIFFERENTIAL/PLATELET - Abnormal; Notable for the following components:      Result Value   WBC 14.2 (*)    Neutro Abs 10.4 (*)    All other components within normal limits  BASIC METABOLIC PANEL    EKG None  Radiology DG Hip Unilat W or Wo Pelvis 2-3 Views Left  Result Date: 11/17/2022 CLINICAL DATA:  Fall EXAM: DG HIP (WITH OR WITHOUT PELVIS) 2-3V LEFT COMPARISON:  None Available. FINDINGS: There is a nondisplaced left femoral neck fracture. There is no dislocation. Are mild degenerative changes of the left hip. Vascular calcifications are seen in the soft tissues. IMPRESSION: Nondisplaced left femoral neck fracture. Electronically Signed   By: Ronney Asters M.D.   On: 11/17/2022 22:19   DG  Chest 1 View  Result Date: 11/17/2022 CLINICAL DATA:  Status post fall. EXAM: CHEST  1 VIEW COMPARISON:  June 27, 2022 FINDINGS: The heart size and mediastinal contours are within normal limits. There is moderate severity calcification of the aortic arch. Low lung volumes are noted. There is no evidence of an acute infiltrate, pleural effusion or pneumothorax. The visualized skeletal structures are unremarkable. IMPRESSION: Low lung volumes without evidence of acute cardiopulmonary disease. Electronically Signed   By: Virgina Norfolk  M.D.   On: 11/17/2022 22:18    Procedures Procedures    Medications Ordered in ED Medications - No data to display  ED Course/ Medical Decision Making/ A&P                           Medical Decision Making Risk Decision regarding hospitalization.   CBC white blood cell count 14,000 hemoglobin is 14.3.  Platelets are good at 237.  Chest x-ray low lung volumes without evidence of acute cardiopulmonary disease.  Oxygen sats are little bit 8988% on room air with put on 2 L of oxygen.  Chest x-ray very clear most likely secondary to the fentanyl she just has a little bit of mild hypoxia secondary to that.  X-ray of the left hip is consistent with a nondisplaced left femoral neck fracture.  Family is requesting Dr. Aundria Rud from [facility].  He is currently taking care of the patient's son.  Dr. Aundria Rud contacted.  He will see the patient from an orthopedic standpoint.  Hospitalist admit.  Patient's basic metabolic panel is pending and EKG is pending.   Final Clinical Impression(s) / ED Diagnoses Final diagnoses:  Fall, initial encounter  Closed fracture of left hip, initial encounter [facility])    Rx / DC Orders ED Discharge Orders     None         Vanetta Mulders, MD 11/17/22 2336

## 2022-11-17 NOTE — Progress Notes (Signed)
Case dw EDP.  CT scan ordered left hip to further characterize injury.  Possible surgery tomorrow.  NPO for now.  Appreciate hospitalists admission.

## 2022-11-17 NOTE — ED Triage Notes (Signed)
Pt states she lost her balance walking down narrow carpeted floor with her walker. L leg is shortened and rotated. Pt is hard of hearing and not on blood thinners. Son was home. No other injuries noted. EMS gave 100 fentanyl.

## 2022-11-17 NOTE — ED Notes (Signed)
Patient transported to X-ray 

## 2022-11-17 NOTE — ED Notes (Signed)
Patient transported to CT 

## 2022-11-17 NOTE — H&P (Signed)
History and Physical      [name] TDD:[id number] DOB: 1928/08/27 DOA: 11/17/2022  PCP: [name], MD *** Patient coming from: home ***  I have personally briefly reviewed patient's old medical records in [facility]  Chief Complaint: ***  Michaela Herrera is a 89 y.o. female with medical history significant for *** who is admitted to [facility] on 11/17/2022 with *** after presenting from home*** to [facility] ED complaining of ***.   ***        ***  ED Course:  Vital signs in the ED were notable for the following: ***  Labs were notable for the following: ***  Per my interpretation, EKG in ED demonstrated the following:  ***  Imaging and additional notable ED work-up: ***  While in the ED, the following were administered: ***  Subsequently, the patient was admitted  ***  ***red   Review of Systems: As per HPI otherwise 10 point review of systems negative.   Past Medical History:  Diagnosis Date   Diabetes mellitus without complication (HCC)    Diabetic retinopathy (HCC)    NPDR OU   GERD (gastroesophageal reflux disease)    Hypertension    Hypertensive retinopathy    OU    Past Surgical History:  Procedure Laterality Date   APPENDECTOMY     CATARACT EXTRACTION Bilateral    COLONOSCOPY  2005   EYE SURGERY Bilateral    Cat Sx    Social History:  reports that she has never smoked. She has never used smokeless tobacco. She reports that she does not drink alcohol and does not use drugs.   No Known Allergies  History reviewed. No pertinent family history.  Family history reviewed and not pertinent ***   Prior to Admission medications   Medication Sig Start Date End Date Taking? Authorizing Provider  acetaminophen (TYLENOL) 500 MG tablet Take 1 tablet (500 mg total) by mouth every 8 (eight) hours as needed for moderate pain or mild pain. 06/29/22 06/29/23  Leroy Sea, MD  amLODipine (NORVASC) 5 MG tablet Take 5 mg by  mouth 2 (two) times daily. 12/11/15   [provider]  aspirin EC 81 MG tablet Take 81 mg by mouth in the morning. Swallow whole.    [provider]  atorvastatin (LIPITOR) 10 MG tablet Take 10 mg by mouth See admin instructions. Take 10 mg by mouth at bedtime on Mon/Wed/Fri only 07/07/20   [provider]  cloNIDine (CATAPRES) 0.2 MG tablet Take 1 tablet (0.2 mg total) by mouth 3 (three) times daily. 06/29/22   Leroy Sea, MD  Cyanocobalamin (VITAMIN B 12 PO) Take 1 tablet by mouth daily with breakfast.    [provider]  furosemide (LASIX) 20 MG tablet Take 1 tablet (20 mg total) by mouth every Monday, Wednesday, and Friday. 07/02/22   Leroy Sea, MD  glipiZIDE (GLUCOTROL XL) 5 MG 24 hr tablet Take 1 tablet (5 mg total) by mouth daily with breakfast. Patient taking differently: Take 2.5 mg by mouth daily with breakfast. 01/23/16   [name], MD  isosorbide mononitrate (IMDUR) 60 MG 24 hr tablet Take 60 mg by mouth daily. 12/04/15   [provider]  JANUVIA 100 MG tablet Take 50 mg by mouth daily. 10/07/20   [provider]  JARDIANCE 10 MG TABS tablet Take 10 mg by mouth daily. 06/02/22   [provider]  metFORMIN (GLUCOPHAGE) 500 MG tablet Take 500 mg  by mouth 2 (two) times daily with a meal.    [provider]  metoprolol (LOPRESSOR) 50 MG tablet Take 50 mg by mouth in the morning. 12/04/15   [provider]  potassium chloride (KLOR-CON M) 10 MEQ tablet Take 1 tablet (10 mEq total) by mouth daily. 07/02/22   Thurnell Lose, MD     Objective    Physical Exam: Vitals:   11/17/22 2131 11/17/22 2145 11/17/22 2245  BP: (!) 171/65 (!) 142/64   Pulse: 81 79 78  Resp: 18 (!) 21   Temp: 97.9 F (36.6 C)    TempSrc: Oral    SpO2: 92% 93% 90%    General: appears to be stated age; alert, oriented Skin: warm, dry, no rash Head:  AT/[place] Mouth:  Oral mucosa membranes appear moist, normal  dentition Neck: supple; trachea midline Heart:  RRR; did not appreciate any M/R/G Lungs: CTAB, did not appreciate any wheezes, rales, or rhonchi Abdomen: + BS; soft, ND, NT Vascular: 2+ pedal pulses b/l; 2+ radial pulses b/l Extremities: no peripheral edema, no muscle wasting Neuro: strength and sensation intact in upper and lower extremities b/l    *** Neuro: 5/5 strength of the proximal and distal flexors and extensors of the upper and lower extremities bilaterally; sensation intact in upper and lower extremities b/l; cranial nerves II through XII grossly intact; no pronator drift; no evidence suggestive of slurred speech, dysarthria, or facial droop; Normal muscle tone. No tremors. *** Neuro: In the setting of the patient's current mental status and associated inability to follow instructions, unable to perform full neurologic exam at this time.  As such, assessment of strength, sensation, and cranial nerves is limited at this time. Patient noted to spontaneously move all 4 extremities. No tremors.  ***    Labs on Admission: I have personally reviewed following labs and imaging studies  CBC: Recent Labs  Lab 11/17/22 2139  WBC 14.2*  NEUTROABS 10.4*  HGB 14.3  HCT 44.5  MCV 89.4  PLT 545   Basic Metabolic Panel: No results for input(s): "NA", "K", "CL", "CO2", "GLUCOSE", "BUN", "CREATININE", "CALCIUM", "MG", "PHOS" in the last 168 hours. GFR: CrCl cannot be calculated (Patient's most recent lab result is older than the maximum 21 days allowed.). Liver Function Tests: No results for input(s): "AST", "ALT", "ALKPHOS", "BILITOT", "PROT", "ALBUMIN" in the last 168 hours. No results for input(s): "LIPASE", "AMYLASE" in the last 168 hours. No results for input(s): "AMMONIA" in the last 168 hours. Coagulation Profile: No results for input(s): "INR", "PROTIME" in the last 168 hours. Cardiac Enzymes: No results for input(s): "CKTOTAL", "CKMB", "CKMBINDEX", "TROPONINI" in the last  168 hours. BNP (last 3 results) No results for input(s): "PROBNP" in the last 8760 hours. HbA1C: No results for input(s): "HGBA1C" in the last 72 hours. CBG: No results for input(s): "GLUCAP" in the last 168 hours. Lipid Profile: No results for input(s): "CHOL", "HDL", "LDLCALC", "TRIG", "CHOLHDL", "LDLDIRECT" in the last 72 hours. Thyroid Function Tests: No results for input(s): "TSH", "T4TOTAL", "FREET4", "T3FREE", "THYROIDAB" in the last 72 hours. Anemia Panel: No results for input(s): "VITAMINB12", "FOLATE", "FERRITIN", "TIBC", "IRON", "RETICCTPCT" in the last 72 hours. Urine analysis:    Component Value Date/Time   COLORURINE COLORLESS (A) 06/27/2022 1541   APPEARANCEUR CLEAR 06/27/2022 1541   LABSPEC 1.005 06/27/2022 1541   PHURINE 5.0 06/27/2022 1541   GLUCOSEU >1,000 (A) 06/27/2022 1541   HGBUR NEGATIVE 06/27/2022 1541   BILIRUBINUR NEGATIVE 06/27/2022 1541   KETONESUR NEGATIVE  06/27/2022 [place] 06/27/2022 1541   NITRITE NEGATIVE 06/27/2022 1541   LEUKOCYTESUR NEGATIVE 06/27/2022 1541    Radiological Exams on Admission: DG Hip Unilat W or Wo Pelvis 2-3 Views Left  Result Date: 11/17/2022 CLINICAL DATA:  Fall EXAM: DG HIP (WITH OR WITHOUT PELVIS) 2-3V LEFT COMPARISON:  None Available. FINDINGS: There is a nondisplaced left femoral neck fracture. There is no dislocation. Are mild degenerative changes of the left hip. Vascular calcifications are seen in the soft tissues. IMPRESSION: Nondisplaced left femoral neck fracture. Electronically Signed   By: Ronney Asters M.D.   On: 11/17/2022 22:19   DG Chest 1 View  Result Date: 11/17/2022 CLINICAL DATA:  Status post fall. EXAM: CHEST  1 VIEW COMPARISON:  June 27, 2022 FINDINGS: The heart size and mediastinal contours are within normal limits. There is moderate severity calcification of the aortic arch. Low lung volumes are noted. There is no evidence of an acute infiltrate, pleural effusion or pneumothorax.  The visualized skeletal structures are unremarkable. IMPRESSION: Low lung volumes without evidence of acute cardiopulmonary disease. Electronically Signed   By: Virgina Norfolk M.D.   On: 11/17/2022 22:18      Assessment/Plan    Principal Problem:   Closed left hip fracture (HCC)  ***      ***          ***           ***          ***          ***          ***          ***          ***          ***          ***     ***  DVT prophylaxis: SCD's ***  Code Status: Full code*** Family Communication: none*** Disposition Plan: Per Rounding Team Consults called: none***;  Admission status: ***    I SPENT GREATER THAN 75 *** MINUTES IN CLINICAL CARE TIME/MEDICAL DECISION-MAKING IN COMPLETING THIS ADMISSION.     Metter DO [facility] [place]   11/17/2022, 11:53 PM   ***

## 2022-11-18 ENCOUNTER — Inpatient Hospital Stay (HOSPITAL_COMMUNITY): Payer: Medicare HMO

## 2022-11-18 ENCOUNTER — Encounter (HOSPITAL_COMMUNITY): Payer: Self-pay | Admitting: Internal Medicine

## 2022-11-18 ENCOUNTER — Inpatient Hospital Stay (HOSPITAL_COMMUNITY): Payer: Medicare HMO | Admitting: Anesthesiology

## 2022-11-18 ENCOUNTER — Other Ambulatory Visit: Payer: Self-pay

## 2022-11-18 ENCOUNTER — Encounter (HOSPITAL_COMMUNITY)
Admission: EM | Disposition: A | Discharge status: Skilled Nursing Facility | Payer: Self-pay | Source: Home / Self Care | Attending: Internal Medicine

## 2022-11-18 DIAGNOSIS — I110 Hypertensive heart disease with heart failure: Secondary | ICD-10-CM

## 2022-11-18 DIAGNOSIS — Z7984 Long term (current) use of oral hypoglycemic drugs: Secondary | ICD-10-CM

## 2022-11-18 DIAGNOSIS — I5032 Chronic diastolic (congestive) heart failure: Secondary | ICD-10-CM | POA: Diagnosis present

## 2022-11-18 DIAGNOSIS — E119 Type 2 diabetes mellitus without complications: Secondary | ICD-10-CM | POA: Diagnosis not present

## 2022-11-18 DIAGNOSIS — D72829 Elevated white blood cell count, unspecified: Secondary | ICD-10-CM | POA: Diagnosis present

## 2022-11-18 DIAGNOSIS — I5030 Unspecified diastolic (congestive) heart failure: Secondary | ICD-10-CM

## 2022-11-18 DIAGNOSIS — S72002A Fracture of unspecified part of neck of left femur, initial encounter for closed fracture: Secondary | ICD-10-CM | POA: Diagnosis not present

## 2022-11-18 DIAGNOSIS — J9601 Acute respiratory failure with hypoxia: Secondary | ICD-10-CM | POA: Diagnosis present

## 2022-11-18 HISTORY — PX: ANTERIOR APPROACH HEMI HIP ARTHROPLASTY: SHX6690

## 2022-11-18 LAB — BASIC METABOLIC PANEL WITH GFR
Anion gap: 9 (ref 5–15)
BUN: 20 mg/dL (ref 8–23)
CO2: 21 mmol/L — ABNORMAL LOW (ref 22–32)
Calcium: 11.6 mg/dL — ABNORMAL HIGH (ref 8.9–10.3)
Chloride: 111 mmol/L (ref 98–111)
Creatinine, Ser: 1.11 mg/dL — ABNORMAL HIGH (ref 0.44–1.00)
GFR, Estimated: 47 mL/min — ABNORMAL LOW
Glucose, Bld: 189 mg/dL — ABNORMAL HIGH (ref 70–99)
Potassium: 4.4 mmol/L (ref 3.5–5.1)
Sodium: 141 mmol/L (ref 135–145)

## 2022-11-18 LAB — CBC WITH DIFFERENTIAL/PLATELET
Abs Immature Granulocytes: 0.07 10*3/uL (ref 0.00–0.07)
Basophils Absolute: 0.1 10*3/uL (ref 0.0–0.1)
Basophils Relative: 0 %
Eosinophils Absolute: 0.1 10*3/uL (ref 0.0–0.5)
Eosinophils Relative: 0 %
HCT: 44.8 % (ref 36.0–46.0)
Hemoglobin: 14.2 g/dL (ref 12.0–15.0)
Immature Granulocytes: 0 %
Lymphocytes Relative: 7 %
Lymphs Abs: 1.2 10*3/uL (ref 0.7–4.0)
MCH: 28.5 pg (ref 26.0–34.0)
MCHC: 31.7 g/dL (ref 30.0–36.0)
MCV: 89.8 fL (ref 80.0–100.0)
Monocytes Absolute: 0.7 10*3/uL (ref 0.1–1.0)
Monocytes Relative: 4 %
Neutro Abs: 15.4 10*3/uL — ABNORMAL HIGH (ref 1.7–7.7)
Neutrophils Relative %: 89 %
Platelets: 230 10*3/uL (ref 150–400)
RBC: 4.99 MIL/uL (ref 3.87–5.11)
RDW: 13.7 % (ref 11.5–15.5)
WBC: 17.4 10*3/uL — ABNORMAL HIGH (ref 4.0–10.5)
nRBC: 0 % (ref 0.0–0.2)

## 2022-11-18 LAB — URINALYSIS, COMPLETE (UACMP) WITH MICROSCOPIC
Bilirubin Urine: NEGATIVE
Glucose, UA: >=500 mg/dL — AB
Hgb urine dipstick: NEGATIVE
Ketones, ur: 20 mg/dL — AB
Nitrite: NEGATIVE
Protein, ur: NEGATIVE mg/dL
Specific Gravity, Urine: 1.022 (ref 1.005–1.030)
pH: 5 (ref 5.0–8.0)

## 2022-11-18 LAB — GLUCOSE, CAPILLARY
Glucose-Capillary: 164 mg/dL — ABNORMAL HIGH (ref 70–99)
Glucose-Capillary: 137 mg/dL — ABNORMAL HIGH (ref 70–99)
Glucose-Capillary: 145 mg/dL — ABNORMAL HIGH (ref 70–99)
Glucose-Capillary: 143 mg/dL — ABNORMAL HIGH (ref 70–99)
Glucose-Capillary: 150 mg/dL — ABNORMAL HIGH (ref 70–99)

## 2022-11-18 LAB — MAGNESIUM
Magnesium: 1.8 mg/dL (ref 1.7–2.4)
Magnesium: 2.1 mg/dL (ref 1.7–2.4)

## 2022-11-18 LAB — TYPE AND SCREEN
ABO/RH(D): O POS
Antibody Screen: NEGATIVE

## 2022-11-18 LAB — PROTIME-INR
INR: 1 (ref 0.8–1.2)
Prothrombin Time: 13.4 seconds (ref 11.4–15.2)

## 2022-11-18 LAB — COMPREHENSIVE METABOLIC PANEL
ALT: 18 U/L (ref 0–44)
AST: 20 U/L (ref 15–41)
Albumin: 3.9 g/dL (ref 3.5–5.0)
Alkaline Phosphatase: 131 U/L — ABNORMAL HIGH (ref 38–126)
Anion gap: 10 (ref 5–15)
BUN: 21 mg/dL (ref 8–23)
CO2: 23 mmol/L (ref 22–32)
Calcium: 12.3 mg/dL — ABNORMAL HIGH (ref 8.9–10.3)
Chloride: 110 mmol/L (ref 98–111)
Creatinine, Ser: 0.97 mg/dL (ref 0.44–1.00)
GFR, Estimated: 55 mL/min — ABNORMAL LOW (ref >60)
Glucose, Bld: 189 mg/dL — ABNORMAL HIGH (ref 70–99)
Potassium: 4.4 mmol/L (ref 3.5–5.1)
Sodium: 143 mmol/L (ref 135–145)
Total Bilirubin: 0.9 mg/dL (ref 0.3–1.2)
Total Protein: 7.1 g/dL (ref 6.5–8.1)

## 2022-11-18 LAB — ABO/RH: ABO/RH(D): O POS

## 2022-11-18 LAB — PHOSPHORUS: Phosphorus: 2.7 mg/dL (ref 2.5–4.6)

## 2022-11-18 LAB — SURGICAL PCR SCREEN
MRSA, PCR: NEGATIVE
Staphylococcus aureus: NEGATIVE

## 2022-11-18 LAB — PROCALCITONIN: Procalcitonin: <0.1 ng/mL

## 2022-11-18 LAB — BRAIN NATRIURETIC PEPTIDE: B Natriuretic Peptide: 117.9 pg/mL — ABNORMAL HIGH (ref 0.0–100.0)

## 2022-11-18 SURGERY — HEMIARTHROPLASTY, HIP, DIRECT ANTERIOR APPROACH, FOR FRACTURE
Anesthesia: General | Site: Hip | Laterality: Left

## 2022-11-18 MED ORDER — ACETAMINOPHEN 500 MG PO TABS
500.0000 mg | ORAL_TABLET | Freq: Four times a day (QID) | ORAL | Status: AC
  Administered 2022-11-18: 500 mg via ORAL
  Filled 2022-11-18: qty 1

## 2022-11-18 MED ORDER — PHENOL 1.4 % MT LIQD: 1.0000 | OROMUCOSAL | Status: DC | PRN

## 2022-11-18 MED ORDER — QUETIAPINE FUMARATE 25 MG PO TABS
25.0000 mg | ORAL_TABLET | Freq: Every day | ORAL | Status: DC
  Administered 2022-11-18: 25 mg via ORAL
  Filled 2022-11-18: qty 1

## 2022-11-18 MED ORDER — FENTANYL CITRATE (PF) 100 MCG/2ML IJ SOLN
INTRAMUSCULAR | Status: DC | PRN
  Administered 2022-11-18 (×2): 50 ug via INTRAVENOUS

## 2022-11-18 MED ORDER — SODIUM CHLORIDE (PF) 0.9 % IJ SOLN
INTRAMUSCULAR | Status: DC | PRN
  Administered 2022-11-18: 30 mL via INTRAVENOUS

## 2022-11-18 MED ORDER — KETOROLAC TROMETHAMINE 30 MG/ML IJ SOLN
INTRAMUSCULAR | Status: AC
  Filled 2022-11-18: qty 1

## 2022-11-18 MED ORDER — BUPIVACAINE-EPINEPHRINE (PF) 0.5% -1:200000 IJ SOLN
INTRAMUSCULAR | Status: DC | PRN
  Administered 2022-11-18: 30 mL via PERINEURAL

## 2022-11-18 MED ORDER — OXYCODONE HCL 5 MG PO TABS: 5.0000 mg | ORAL_TABLET | Freq: Once | ORAL | Status: DC | PRN

## 2022-11-18 MED ORDER — PHENYLEPHRINE HCL-NACL 20-0.9 MG/250ML-% IV SOLN
INTRAVENOUS | Status: DC | PRN
  Administered 2022-11-18: 40 ug/min via INTRAVENOUS

## 2022-11-18 MED ORDER — CLONIDINE HCL 0.1 MG PO TABS
0.2000 mg | ORAL_TABLET | Freq: Every day | ORAL | Status: DC
  Administered 2022-11-18 – 2022-11-22 (×4): 0.2 mg via ORAL
  Filled 2022-11-18 (×4): qty 2

## 2022-11-18 MED ORDER — MENTHOL 3 MG MT LOZG: 1.0000 | LOZENGE | OROMUCOSAL | Status: DC | PRN

## 2022-11-18 MED ORDER — ROCURONIUM BROMIDE 10 MG/ML (PF) SYRINGE
PREFILLED_SYRINGE | INTRAVENOUS | Status: AC
  Filled 2022-11-18: qty 10

## 2022-11-18 MED ORDER — ONDANSETRON HCL 4 MG/2ML IJ SOLN
INTRAMUSCULAR | Status: DC | PRN
  Administered 2022-11-18: 4 mg via INTRAVENOUS

## 2022-11-18 MED ORDER — METHOCARBAMOL 500 MG IVPB - SIMPLE MED: 500.0000 mg | Freq: Four times a day (QID) | INTRAVENOUS | Status: DC | PRN

## 2022-11-18 MED ORDER — METOCLOPRAMIDE HCL 5 MG/ML IJ SOLN: 5.0000 mg | Freq: Three times a day (TID) | INTRAMUSCULAR | Status: DC | PRN

## 2022-11-18 MED ORDER — LIDOCAINE HCL (PF) 2 % IJ SOLN
INTRAMUSCULAR | Status: AC
  Filled 2022-11-18: qty 5

## 2022-11-18 MED ORDER — SODIUM CHLORIDE 0.9 % IV SOLN
1.0000 g | INTRAVENOUS | Status: DC
  Administered 2022-11-18 – 2022-11-23 (×6): 1 g via INTRAVENOUS
  Filled 2022-11-18 (×6): qty 10

## 2022-11-18 MED ORDER — HALOPERIDOL LACTATE 5 MG/ML IJ SOLN
2.5000 mg | Freq: Once | INTRAMUSCULAR | Status: AC
  Administered 2022-11-18: 2.5 mg via INTRAMUSCULAR
  Filled 2022-11-18: qty 1

## 2022-11-18 MED ORDER — SODIUM CHLORIDE 0.9 % IV SOLN: INTRAVENOUS | Status: DC

## 2022-11-18 MED ORDER — ONDANSETRON HCL 4 MG/2ML IJ SOLN: 4.0000 mg | Freq: Four times a day (QID) | INTRAMUSCULAR | Status: DC | PRN

## 2022-11-18 MED ORDER — METHOCARBAMOL 500 MG PO TABS
500.0000 mg | ORAL_TABLET | Freq: Four times a day (QID) | ORAL | Status: DC | PRN
  Administered 2022-11-18: 500 mg via ORAL
  Filled 2022-11-18: qty 1

## 2022-11-18 MED ORDER — DOCUSATE SODIUM 100 MG PO CAPS
100.0000 mg | ORAL_CAPSULE | Freq: Two times a day (BID) | ORAL | Status: DC
  Administered 2022-11-18 – 2022-11-23 (×8): 100 mg via ORAL
  Filled 2022-11-18 (×8): qty 1

## 2022-11-18 MED ORDER — POLYETHYLENE GLYCOL 3350 17 G PO PACK
17.0000 g | PACK | Freq: Every day | ORAL | Status: DC
  Administered 2022-11-20 – 2022-11-23 (×4): 17 g via ORAL
  Filled 2022-11-18 (×4): qty 1

## 2022-11-18 MED ORDER — CEFAZOLIN SODIUM-DEXTROSE 2-4 GM/100ML-% IV SOLN
2.0000 g | INTRAVENOUS | Status: AC
  Administered 2022-11-18: 2 g via INTRAVENOUS
  Filled 2022-11-18: qty 100

## 2022-11-18 MED ORDER — INSULIN ASPART 100 UNIT/ML IJ SOLN
0.0000 [IU] | Freq: Four times a day (QID) | INTRAMUSCULAR | Status: DC
  Administered 2022-11-18 – 2022-11-20 (×2): 1 [IU] via SUBCUTANEOUS
  Administered 2022-11-20: 3 [IU] via SUBCUTANEOUS
  Administered 2022-11-21: 1 [IU] via SUBCUTANEOUS
  Administered 2022-11-21: 3 [IU] via SUBCUTANEOUS
  Administered 2022-11-22 (×2): 1 [IU] via SUBCUTANEOUS
  Administered 2022-11-22: 3 [IU] via SUBCUTANEOUS
  Administered 2022-11-23: 1 [IU] via SUBCUTANEOUS
  Administered 2022-11-23: 3 [IU] via SUBCUTANEOUS
  Filled 2022-11-18: qty 0.06

## 2022-11-18 MED ORDER — SODIUM CHLORIDE (PF) 0.9 % IJ SOLN
INTRAMUSCULAR | Status: AC
  Filled 2022-11-18: qty 50

## 2022-11-18 MED ORDER — MUPIROCIN 2 % EX OINT: 1.0000 | TOPICAL_OINTMENT | Freq: Two times a day (BID) | CUTANEOUS | Status: DC

## 2022-11-18 MED ORDER — ACETAMINOPHEN 325 MG PO TABS: 325.0000 mg | ORAL_TABLET | Freq: Four times a day (QID) | ORAL | Status: DC | PRN

## 2022-11-18 MED ORDER — DEXAMETHASONE SODIUM PHOSPHATE 10 MG/ML IJ SOLN
INTRAMUSCULAR | Status: AC
  Filled 2022-11-18: qty 1

## 2022-11-18 MED ORDER — AMLODIPINE BESYLATE 5 MG PO TABS
5.0000 mg | ORAL_TABLET | Freq: Two times a day (BID) | ORAL | Status: DC
  Administered 2022-11-18 – 2022-11-23 (×9): 5 mg via ORAL
  Filled 2022-11-18 (×9): qty 1

## 2022-11-18 MED ORDER — SODIUM CHLORIDE 0.9 % IR SOLN
Status: DC | PRN
  Administered 2022-11-18: 1000 mL

## 2022-11-18 MED ORDER — TRANEXAMIC ACID-NACL 1000-0.7 MG/100ML-% IV SOLN
1000.0000 mg | INTRAVENOUS | Status: AC
  Administered 2022-11-18: 1000 mg via INTRAVENOUS
  Filled 2022-11-18: qty 100

## 2022-11-18 MED ORDER — POVIDONE-IODINE 10 % EX SWAB
2.0000 | Freq: Once | CUTANEOUS | Status: AC
  Administered 2022-11-18: 2 via TOPICAL

## 2022-11-18 MED ORDER — 0.9 % SODIUM CHLORIDE (POUR BTL) OPTIME
TOPICAL | Status: DC | PRN
  Administered 2022-11-18: 1000 mL

## 2022-11-18 MED ORDER — DEXAMETHASONE SODIUM PHOSPHATE 10 MG/ML IJ SOLN
INTRAMUSCULAR | Status: DC | PRN
  Administered 2022-11-18: 5 mg via INTRAVENOUS

## 2022-11-18 MED ORDER — PHENYLEPHRINE 80 MCG/ML (10ML) SYRINGE FOR IV PUSH (FOR BLOOD PRESSURE SUPPORT)
PREFILLED_SYRINGE | INTRAVENOUS | Status: AC
  Filled 2022-11-18: qty 10

## 2022-11-18 MED ORDER — CALCITONIN (SALMON) 200 UNIT/ACT NA SOLN
1.0000 | Freq: Every day | NASAL | Status: DC
  Administered 2022-11-21 – 2022-11-23 (×3): 1 via NASAL
  Filled 2022-11-18 (×2): qty 3.7

## 2022-11-18 MED ORDER — PROPOFOL 500 MG/50ML IV EMUL
INTRAVENOUS | Status: DC | PRN
  Administered 2022-11-18: 50 mg via INTRAVENOUS
  Administered 2022-11-18 (×2): 20 mg via INTRAVENOUS
  Administered 2022-11-18: 50 ug/kg/min via INTRAVENOUS

## 2022-11-18 MED ORDER — HYDROCODONE-ACETAMINOPHEN 5-325 MG PO TABS: 1.0000 | ORAL_TABLET | ORAL | Status: DC | PRN

## 2022-11-18 MED ORDER — ONDANSETRON HCL 4 MG/2ML IJ SOLN
INTRAMUSCULAR | Status: AC
  Filled 2022-11-18: qty 2

## 2022-11-18 MED ORDER — LIDOCAINE HCL (PF) 2 % IJ SOLN
INTRAMUSCULAR | Status: DC | PRN
  Administered 2022-11-18: 60 mg via INTRADERMAL

## 2022-11-18 MED ORDER — PHENYLEPHRINE 80 MCG/ML (10ML) SYRINGE FOR IV PUSH (FOR BLOOD PRESSURE SUPPORT)
PREFILLED_SYRINGE | INTRAVENOUS | Status: DC | PRN
  Administered 2022-11-18: 80 ug via INTRAVENOUS
  Administered 2022-11-18: 160 ug via INTRAVENOUS
  Administered 2022-11-18: 80 ug via INTRAVENOUS

## 2022-11-18 MED ORDER — LACTATED RINGERS IV SOLN: INTRAVENOUS | Status: DC | PRN

## 2022-11-18 MED ORDER — PROPOFOL 10 MG/ML IV BOLUS
INTRAVENOUS | Status: AC
  Filled 2022-11-18: qty 20

## 2022-11-18 MED ORDER — PHENYLEPHRINE HCL-NACL 20-0.9 MG/250ML-% IV SOLN
INTRAVENOUS | Status: AC
  Filled 2022-11-18: qty 250

## 2022-11-18 MED ORDER — POVIDONE-IODINE 10 % EX SWAB: 2.0000 | Freq: Once | CUTANEOUS | Status: DC

## 2022-11-18 MED ORDER — ROCURONIUM BROMIDE 10 MG/ML (PF) SYRINGE
PREFILLED_SYRINGE | INTRAVENOUS | Status: DC | PRN
  Administered 2022-11-18: 30 mg via INTRAVENOUS

## 2022-11-18 MED ORDER — POLYETHYLENE GLYCOL 3350 17 G PO PACK: 17.0000 g | PACK | Freq: Every day | ORAL | Status: DC | PRN

## 2022-11-18 MED ORDER — FENTANYL CITRATE PF 50 MCG/ML IJ SOSY: 25.0000 ug | PREFILLED_SYRINGE | INTRAMUSCULAR | Status: DC | PRN

## 2022-11-18 MED ORDER — HYDROCODONE-ACETAMINOPHEN 7.5-325 MG PO TABS: 1.0000 | ORAL_TABLET | ORAL | Status: DC | PRN

## 2022-11-18 MED ORDER — SUGAMMADEX SODIUM 200 MG/2ML IV SOLN
INTRAVENOUS | Status: DC | PRN
  Administered 2022-11-18: 120 mg via INTRAVENOUS

## 2022-11-18 MED ORDER — MORPHINE SULFATE (PF) 2 MG/ML IV SOLN: 2.0000 mg | INTRAVENOUS | Status: DC | PRN

## 2022-11-18 MED ORDER — KETOROLAC TROMETHAMINE 30 MG/ML IJ SOLN
INTRAMUSCULAR | Status: DC | PRN
  Administered 2022-11-18: 30 mg

## 2022-11-18 MED ORDER — QUETIAPINE FUMARATE 25 MG PO TABS: 25.0000 mg | ORAL_TABLET | Freq: Every day | ORAL | Status: DC

## 2022-11-18 MED ORDER — ACETAMINOPHEN 500 MG PO TABS
1000.0000 mg | ORAL_TABLET | ORAL | Status: AC
  Administered 2022-11-18: 1000 mg via ORAL
  Filled 2022-11-18: qty 2

## 2022-11-18 MED ORDER — OXYCODONE HCL 5 MG/5ML PO SOLN: 5.0000 mg | Freq: Once | ORAL | Status: DC | PRN

## 2022-11-18 MED ORDER — OXYCODONE HCL 5 MG PO TABS: 5.0000 mg | ORAL_TABLET | Freq: Four times a day (QID) | ORAL | Status: DC | PRN

## 2022-11-18 MED ORDER — HYDRALAZINE HCL 20 MG/ML IJ SOLN: 10.0000 mg | Freq: Four times a day (QID) | INTRAMUSCULAR | Status: DC | PRN

## 2022-11-18 MED ORDER — LIP MEDEX EX OINT
TOPICAL_OINTMENT | CUTANEOUS | Status: DC | PRN
  Filled 2022-11-18: qty 7

## 2022-11-18 MED ORDER — FENTANYL CITRATE (PF) 100 MCG/2ML IJ SOLN
INTRAMUSCULAR | Status: AC
  Filled 2022-11-18: qty 2

## 2022-11-18 MED ORDER — MORPHINE SULFATE (PF) 2 MG/ML IV SOLN: 0.5000 mg | INTRAVENOUS | Status: DC | PRN

## 2022-11-18 MED ORDER — ATORVASTATIN CALCIUM 10 MG PO TABS
10.0000 mg | ORAL_TABLET | ORAL | Status: DC
  Administered 2022-11-22: 10 mg via ORAL
  Filled 2022-11-18: qty 1

## 2022-11-18 MED ORDER — METOCLOPRAMIDE HCL 5 MG PO TABS: 5.0000 mg | ORAL_TABLET | Freq: Three times a day (TID) | ORAL | Status: DC | PRN

## 2022-11-18 MED ORDER — BUPIVACAINE-EPINEPHRINE (PF) 0.5% -1:200000 IJ SOLN
INTRAMUSCULAR | Status: AC
  Filled 2022-11-18: qty 30

## 2022-11-18 MED ORDER — CHLORHEXIDINE GLUCONATE CLOTH 2 % EX PADS: MEDICATED_PAD | Freq: Once | CUTANEOUS | Status: DC

## 2022-11-18 MED ORDER — SENNOSIDES-DOCUSATE SODIUM 8.6-50 MG PO TABS: 1.0000 | ORAL_TABLET | Freq: Two times a day (BID) | ORAL | Status: DC

## 2022-11-18 MED ORDER — ASPIRIN 325 MG PO TBEC
325.0000 mg | DELAYED_RELEASE_TABLET | Freq: Every day | ORAL | Status: DC
  Administered 2022-11-20 – 2022-11-23 (×4): 325 mg via ORAL
  Filled 2022-11-18 (×4): qty 1

## 2022-11-18 MED ORDER — ONDANSETRON HCL 4 MG PO TABS: 4.0000 mg | ORAL_TABLET | Freq: Four times a day (QID) | ORAL | Status: DC | PRN

## 2022-11-18 MED ORDER — METOPROLOL TARTRATE 50 MG PO TABS
50.0000 mg | ORAL_TABLET | Freq: Every day | ORAL | Status: DC
  Administered 2022-11-18 – 2022-11-23 (×5): 50 mg via ORAL
  Filled 2022-11-18 (×5): qty 1

## 2022-11-18 MED ORDER — SENNA 8.6 MG PO TABS
1.0000 | ORAL_TABLET | Freq: Two times a day (BID) | ORAL | Status: DC
  Administered 2022-11-18 – 2022-11-23 (×8): 8.6 mg via ORAL
  Filled 2022-11-18 (×8): qty 1

## 2022-11-18 MED ORDER — CEFAZOLIN SODIUM-DEXTROSE 2-4 GM/100ML-% IV SOLN
2.0000 g | Freq: Four times a day (QID) | INTRAVENOUS | Status: AC
  Administered 2022-11-19 (×2): 2 g via INTRAVENOUS
  Filled 2022-11-18 (×2): qty 100

## 2022-11-18 MED ORDER — ISOSORBIDE MONONITRATE ER 60 MG PO TB24
60.0000 mg | ORAL_TABLET | Freq: Every day | ORAL | Status: DC
  Administered 2022-11-20 – 2022-11-23 (×4): 60 mg via ORAL
  Filled 2022-11-18 (×4): qty 1

## 2022-11-18 MED ORDER — AMISULPRIDE (ANTIEMETIC) 5 MG/2ML IV SOLN: 10.0000 mg | Freq: Once | INTRAVENOUS | Status: DC | PRN

## 2022-11-18 SURGICAL SUPPLY — 57 items
ADH SKN CLS APL DERMABOND .7 (GAUZE/BANDAGES/DRESSINGS) ×1
APL PRP STRL LF DISP 70% ISPRP (MISCELLANEOUS) ×1
BAG COUNTER SPONGE SURGICOUNT (BAG) ×2 IMPLANT
BAG SPNG CNTER NS LX DISP (BAG) ×1
BLADE CLIPPER SURG (BLADE) IMPLANT
CHLORAPREP W/TINT 26 (MISCELLANEOUS) ×2 IMPLANT
COVER SURGICAL LIGHT HANDLE (MISCELLANEOUS) ×2 IMPLANT
DERMABOND ADVANCED .7 DNX12 (GAUZE/BANDAGES/DRESSINGS) ×4 IMPLANT
DRAPE IMP U-DRAPE 54X76 (DRAPES) ×2 IMPLANT
DRAPE SHEET LG 3/4 BI-LAMINATE (DRAPES) ×4 IMPLANT
DRAPE STERI IOBAN 125X83 (DRAPES) ×2 IMPLANT
DRAPE U-SHAPE 47X51 STRL (DRAPES) ×4 IMPLANT
DRSG AQUACEL AG ADV 3.5X10 (GAUZE/BANDAGES/DRESSINGS) ×2 IMPLANT
ELECT REM PT RETURN 15FT ADLT (MISCELLANEOUS) ×2 IMPLANT
EVACUATOR 1/8 PVC DRAIN (DRAIN) IMPLANT
GLOVE BIO SURGEON STRL SZ8.5 (GLOVE) ×4 IMPLANT
GLOVE BIOGEL M 7.0 STRL (GLOVE) ×2 IMPLANT
GLOVE BIOGEL PI IND STRL 7.5 (GLOVE) ×2 IMPLANT
GLOVE BIOGEL PI IND STRL 8 (GLOVE) ×2 IMPLANT
GLOVE BIOGEL PI IND STRL 8.5 (GLOVE) ×2 IMPLANT
GLOVE SURG LX STRL 7.5 STRW (GLOVE) ×4 IMPLANT
GOWN STRL REUS W/ TWL LRG LVL3 (GOWN DISPOSABLE) ×2 IMPLANT
GOWN STRL REUS W/TWL 2XL LVL3 (GOWN DISPOSABLE) ×2 IMPLANT
GOWN STRL REUS W/TWL LRG LVL3 (GOWN DISPOSABLE) ×1
HANDPIECE INTERPULSE COAX TIP (DISPOSABLE) ×1
HEAD MOD COCR 28MM HD -6MM NK (Orthopedic Implant) IMPLANT
HOOD PEEL AWAY T7 (MISCELLANEOUS) ×6 IMPLANT
JET LAVAGE IRRISEPT WOUND (IRRIGATION / IRRIGATOR) ×1
KIT TURNOVER KIT A (KITS) IMPLANT
LAVAGE JET IRRISEPT WOUND (IRRIGATION / IRRIGATOR) IMPLANT
MANIFOLD NEPTUNE II (INSTRUMENTS) ×2 IMPLANT
MARKER SKIN DUAL TIP RULER LAB (MISCELLANEOUS) ×2 IMPLANT
NDL SPNL 18GX3.5 QUINCKE PK (NEEDLE) ×2 IMPLANT
NEEDLE SPNL 18GX3.5 QUINCKE PK (NEEDLE) ×1 IMPLANT
NS IRRIG 1000ML POUR BTL (IV SOLUTION) ×2 IMPLANT
PACK ANTERIOR HIP CUSTOM (KITS) ×2 IMPLANT
PENCIL SMOKE EVACUATOR (MISCELLANEOUS) IMPLANT
RINGBLOC BI POLAR 28X49MM (Orthopedic Implant) ×1 IMPLANT
SAW OSC TIP CART 19.5X105X1.3 (SAW) ×2 IMPLANT
SEALER BIPOLAR AQUA 6.0 (INSTRUMENTS) ×2 IMPLANT
SET HNDPC FAN SPRY TIP SCT (DISPOSABLE) ×2 IMPLANT
SHELL RINGBLOC BI POLR 28X49MM (Orthopedic Implant) IMPLANT
STAPLER VISISTAT 35W (STAPLE) IMPLANT
STEM FEM CMTLS STD 16X152 (Stem) IMPLANT
SUT ETHIBOND NAB CT1 #1 30IN (SUTURE) ×4 IMPLANT
SUT MNCRL AB 3-0 PS2 18 (SUTURE) ×2 IMPLANT
SUT MON AB 2-0 CT1 36 (SUTURE) ×2 IMPLANT
SUT STRATAFIX PDO 1 14 VIOLET (SUTURE) ×1
SUT STRATFX PDO 1 14 VIOLET (SUTURE) ×1
SUT VIC AB 1 CT1 27 (SUTURE) ×1
SUT VIC AB 1 CT1 27XBRD ANTBC (SUTURE) ×2 IMPLANT
SUT VIC AB 2-0 CT1 27 (SUTURE) ×1
SUT VIC AB 2-0 CT1 TAPERPNT 27 (SUTURE) ×2 IMPLANT
SUTURE STRATFX PDO 1 14 VIOLET (SUTURE) ×2 IMPLANT
TRAY FOLEY MTR SLVR 16FR STAT (SET/KITS/TRAYS/PACK) IMPLANT
TUBE SUCTION HIGH CAP CLEAR NV (SUCTIONS) ×2 IMPLANT
WATER STERILE IRR 1000ML POUR (IV SOLUTION) ×4 IMPLANT

## 2022-11-18 NOTE — Anesthesia Postprocedure Evaluation (Signed)
Anesthesia Post Note  Patient: Michaela Herrera  Procedure(s) Performed: ANTERIOR APPROACH HEMI HIP ARTHROPLASTY (Left: Hip)     Patient location during evaluation: PACU Anesthesia Type: General Level of consciousness: awake and alert and confused Pain management: pain level controlled Vital Signs Assessment: post-procedure vital signs reviewed and stable Respiratory status: spontaneous breathing, nonlabored ventilation, respiratory function stable and patient connected to nasal cannula oxygen Cardiovascular status: blood pressure returned to baseline and stable Postop Assessment: no apparent nausea or vomiting Anesthetic complications: no  No notable events documented.  Last Vitals:  Vitals:   11/18/22 2005 11/18/22 2010  BP:    Pulse:    Resp:    Temp:    SpO2: (!) 88% 91%    Last Pain:  Vitals:   11/18/22 1934  TempSrc:   PainSc: 0-No pain                 Tiajuana Amass

## 2022-11-18 NOTE — Progress Notes (Signed)
Pt is confused due to dementia. At this time pt is cursing and trying to punch this RN. Discussed with Unit RN that due to pt demeanor it is not safe to stick her for IV at this time. Unit RN instructed to notify MD and will place additional consult if needed.

## 2022-11-18 NOTE — Interval H&P Note (Signed)
History and Physical Interval Note:  11/18/2022 4:04 PM  Michaela Herrera  has presented today for surgery, with the diagnosis of LEFT FEMORAL NECK FRACTURE.  The various methods of treatment have been discussed with the patient and family. After consideration of risks, benefits and other options for treatment, the patient has consented to  Procedure(s): ANTERIOR APPROACH HEMI HIP ARTHROPLASTY (Left) as a surgical intervention.  The patient's history has been reviewed, patient examined, no change in status, stable for surgery.  I have reviewed the patient's chart and labs.  Questions were answered to the patient's satisfaction.     Hilton Cork Jaimere Feutz

## 2022-11-18 NOTE — Plan of Care (Signed)
Plan of care discussed.  NWB LLE. NPO. Denies pain if still.  Refused ice.  CHG bath done. Sacral foam applied. Daughter staying in room as support person. Oriented to room and call button.

## 2022-11-18 NOTE — Consult Note (Signed)
ORTHOPAEDIC CONSULTATION  REQUESTING PHYSICIAN: Shelly Coss, MD  PCP:  Dixie Dials, MD  Chief Complaint: left hip injury  HPI: Michaela Herrera is a 89 y.o. female with history of diabetes type 2, hypertension, chronic diastolic CHF, and mild dementia who presented with complaint of left hip pain after ground-level mechanical fall at home 11/17/22. She tripped while attempting to ambulate without a walker at home with immediate pain of left hip and inability to bear weight on the left lower extremity. No history of head injury or loss of consciousness. X-ray of the left hip showed left femoral neck fracture. Orthopedics was consulted.   Patient takes a baby aspirin daily. She denies any tingling or numbness in her LE bilaterally. She normally ambulates with a walker when she remembers. She lives at home with her daughter and son.   Past Medical History:  Diagnosis Date   Diabetes mellitus without complication ([place])    Diabetic retinopathy ([place])    NPDR OU   GERD (gastroesophageal reflux disease)    Hypertension    Hypertensive retinopathy    OU   Past Surgical History:  Procedure Laterality Date   APPENDECTOMY     CATARACT EXTRACTION Bilateral    COLONOSCOPY  2005   EYE SURGERY Bilateral    Cat Sx   Social History   Socioeconomic History   Marital status: Widowed    Spouse name: Not on file   Number of children: Not on file   Years of education: Not on file   Highest education level: Not on file  Occupational History   Not on file  Tobacco Use   Smoking status: Never   Smokeless tobacco: Never  Substance and Sexual Activity   Alcohol use: No   Drug use: No   Sexual activity: Not on file  Other Topics Concern   Not on file  Social History Narrative   Not on file   Social Determinants of Health   Financial Resource Strain: Not on file  Food Insecurity: No Food Insecurity (11/18/2022)   Hunger Vital Sign    Worried About Running Out of Food in the Last  Year: Never true    Ran Out of Food in the Last Year: Never true  Transportation Needs: No Transportation Needs (11/18/2022)   PRAPARE - Hydrologist (Medical): No    Lack of Transportation (Non-Medical): No  Physical Activity: Not on file  Stress: Not on file  Social Connections: Not on file   History reviewed. No pertinent family history. No Known Allergies Prior to Admission medications   Medication Sig Start Date End Date Taking? Authorizing Provider  potassium chloride (KLOR-CON) 10 MEQ tablet Take 10 mEq by mouth daily. 09/21/22  Yes [provider]  QUEtiapine (SEROQUEL) 25 MG tablet Take 25 mg by mouth daily. 11/09/22  Yes [provider]  acetaminophen (TYLENOL) 500 MG tablet Take 1 tablet (500 mg total) by mouth every 8 (eight) hours as needed for moderate pain or mild pain. 06/29/22 06/29/23  Thurnell Lose, MD  amLODipine (NORVASC) 5 MG tablet Take 5 mg by mouth 2 (two) times daily. 12/11/15   [provider]  aspirin EC 81 MG tablet Take 81 mg by mouth in the morning. Swallow whole.    [provider]  atorvastatin (LIPITOR) 10 MG tablet Take 10 mg by mouth See admin instructions. Take 10 mg by mouth at bedtime on Mon/Wed/Fri only 07/07/20   [provider]  cloNIDine (CATAPRES) 0.2 MG tablet Take 1 tablet (0.2 mg total) by mouth 3 (three) times daily. 06/29/22   Thurnell Lose, MD  Cyanocobalamin (VITAMIN B 12 PO) Take 1 tablet by mouth daily with breakfast.    [provider]  furosemide (LASIX) 20 MG tablet Take 1 tablet (20 mg total) by mouth every Monday, Wednesday, and Friday. 07/02/22   Thurnell Lose, MD  glipiZIDE (GLUCOTROL XL) 5 MG 24 hr tablet Take 1 tablet (5 mg total) by mouth daily with breakfast. Patient taking differently: Take 2.5 mg by mouth daily with breakfast. 01/23/16   Dixie Dials, MD  isosorbide mononitrate (IMDUR) 60 MG 24 hr tablet Take 60 mg by mouth daily. 12/04/15    [provider]  JANUVIA 100 MG tablet Take 50 mg by mouth daily. 10/07/20   [provider]  JARDIANCE 10 MG TABS tablet Take 10 mg by mouth daily. 06/02/22   [provider]  metFORMIN (GLUCOPHAGE) 500 MG tablet Take 500 mg by mouth 2 (two) times daily with a meal.    [provider]  metoprolol (LOPRESSOR) 50 MG tablet Take 50 mg by mouth in the morning. 12/04/15   [provider]  potassium chloride (KLOR-CON M) 10 MEQ tablet Take 1 tablet (10 mEq total) by mouth daily. 07/02/22   Thurnell Lose, MD   DG Knee Left Port  Result Date: 11/18/2022 CLINICAL DATA:  Closed displaced fracture left femoral neck. EXAM: PORTABLE LEFT KNEE - 1-2 VIEW COMPARISON:  None Available. FINDINGS: There is diffuse decreased bone mineralization. Severe lateral and moderate medial and patellofemoral compartment joint space narrowing with tricompartmental peripheral degenerative osteophytes. Tiny joint effusion. Within the limitations of diffuse decreased bone mineralization, there is minimal cortical irregularity and possible minimal 1 mm cortical step-off at the lateral aspect of the proximal metaphysis of the fibula, compatible with an acute nondisplaced fracture. However, no distinct fracture line is visible. Additional overlying lucencies and densities from the patient's overlying clothing somewhat limit evaluation of fine bony detail. Moderate vascular calcifications. IMPRESSION: 1. Minimal cortical step-off and minimal cortical irregularity of the lateral proximal aspect of the fibula ends indeterminate but could represent an acute nondisplaced fracture. Recommend clinical correlation for point tenderness. No distinct fracture line is visualized. 2. 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 Electronically Signed   By: Yvonne Kendall M.D.   On: 11/18/2022 08:25   CT HIP LEFT WO CONTRAST  Result Date: 11/18/2022 CLINICAL DATA:  CT of  the left hip to further characterize fracture. Possible surgery fall EXAM: CT OF THE LEFT HIP WITHOUT CONTRAST TECHNIQUE: Multidetector CT imaging of the left hip was performed according to the standard protocol. Multiplanar CT image reconstructions were also generated. RADIATION DOSE REDUCTION: This exam was performed according to the departmental dose-optimization program which includes automated exposure control, adjustment of the mA and/or kV according to patient size and/or use of iterative reconstruction technique. COMPARISON:  Radiographs earlier today FINDINGS: Bones/Joint/Cartilage Redemonstrated acute comminuted subcapital left femoral neck fracture the distal fragment is rotated posteriorly with approximately 8 mm of widening about the apex of the angulation there is slight impaction of the posterior femoral neck into the femoral head. Slight lateral displacement of the distal dominant fragment. No additional fractures identified. Ligaments Suboptimally assessed by CT. Muscles and Tendons Calcifications at the origin of the left hamstring tendons. Soft tissues Mild edema about the left hip. IMPRESSION: Redemonstrated left femoral neck fracture. Electronically Signed   By: Dorothea Ogle  Stutzman M.D.   On: 11/18/2022 00:25   DG Hip Unilat W or Wo Pelvis 2-3 Views Left  Result Date: 11/17/2022 CLINICAL DATA:  Fall EXAM: DG HIP (WITH OR WITHOUT PELVIS) 2-3V LEFT COMPARISON:  None Available. FINDINGS: There is a nondisplaced left femoral neck fracture. There is no dislocation. Are mild degenerative changes of the left hip. Vascular calcifications are seen in the soft tissues. IMPRESSION: Nondisplaced left femoral neck fracture. Electronically Signed   By: Darliss Cheney M.D.   On: 11/17/2022 22:19   DG Chest 1 View  Result Date: 11/17/2022 CLINICAL DATA:  Status post fall. EXAM: CHEST  1 VIEW COMPARISON:  June 27, 2022 FINDINGS: The heart size and mediastinal contours are within normal limits. There is  moderate severity calcification of the aortic arch. Low lung volumes are noted. There is no evidence of an acute infiltrate, pleural effusion or pneumothorax. The visualized skeletal structures are unremarkable. IMPRESSION: Low lung volumes without evidence of acute cardiopulmonary disease. Electronically Signed   By: Aram Candela M.D.   On: 11/17/2022 22:18    Positive ROS: All other systems have been reviewed and were otherwise negative with the exception of those mentioned in the HPI and as above.  Physical Exam: General: Alert, no acute distress. She is oriented to name, month and day of birth.  Cardiovascular: No pedal edema Respiratory: No cyanosis, no use of accessory musculature GI: No organomegaly, abdomen is soft and non-tender Skin: No lesions in the area of chief complaint Neurologic: Sensation intact distally Lymphatic: No axillary or cervical lymphadenopathy  MUSCULOSKELETAL: Examination of the left hip reveals no skin wounds or lesions. Pain with left hip movement and palpation. Her LLE is shortened and externally rotated.   Sensory and motor function intact in LE bilaterally including plantar flexion, dorsiflexion, and EHL. Distal pedal pulses 2+ bilaterally. No significant pedal edema. Calves soft and non-tender.   Assessment: Left femoral neck fracture.   Plan:  I discussed the findings with the patient and her daughter. She has an unstable left femoral neck fracture. She will require surgical treatment for pain control and allow immediate mobilization out of bed. [facility] has already been consulted for admission and perioperative medical optimization. Plan for left hip hemiarthroplasty today. Continue to hold chemical DVT ppx. She has been NPO since midnight. All questions solicited and answered. Call/return precautions discussed.     Clois Dupes, PA-C    11/18/2022 1:10 PM

## 2022-11-18 NOTE — Op Note (Signed)
OPERATIVE REPORT  SURGEON: Rod Can, MD   ASSISTANT: Larene Pickett, PA-C  PREOPERATIVE DIAGNOSIS: Displaced Left femoral neck fracture.   POSTOPERATIVE DIAGNOSIS: Displaced Left femoral neck fracture.   PROCEDURE: Left hip hemiarthroplasty, anterior approach.   IMPLANTS: Biomet Taperloc Complete Reduced Distal stem, size 16 x 132m, standard offset, with a 28-649mmetal head ball and a 49 mm bipolar head ball.  ANESTHESIA:  MAC  ANTIBIOTICS: 2g ancef.  ESTIMATED BLOOD LOSS:-150 mL    DRAINS: None.  COMPLICATIONS: None   CONDITION: PACU - hemodynamically stable.   BRIEF CLINICAL NOTE: Michaela Herrera a [age].o. female with a displaced Left femoral neck fracture. The patient was admitted to the hospitalist service and underwent perioperative risk stratification and medical optimization. The risks, benefits, and alternatives to hemiarthroplasty were explained, and the patient elected to proceed.  PROCEDURE IN DETAIL: The patient was taken to the operating room and general anesthesia was induced on the hospital bed.  The patient was then positioned on the Hana table.  All bony prominences were well padded.  The hip was prepped and draped in the normal sterile surgical fashion.  A time-out was called verifying side and site of surgery. Antibiotics were given within 60 minutes of beginning the procedure.   Bikini incision was made, and the direct anterior approach to the hip was performed through the Hueter interval.  Superficial dissection was carried out lateral to the ASIS. Lateral femoral circumflex vessels were treated with the Auqumantys. The anterior capsule was exposed and an inverted T capsulotomy was made. Fracture hematoma was encountered and evacuated. The patient was found to have a comminuted Left subcapital femoral neck fracture.  Inferior pubofemoral ligament was released subperiosteally to the lesser trochanter. I freshened the femoral neck cut with a saw.  I removed the  femoral neck fragment.  A corkscrew was placed into the head and the head was removed.  This was passed to the back table and was measured.   Acetabular exposure was achieved.  I examined the articular cartilage which was intact.  The labrum was intact. A 49 mm trial head was placed and found to have excellent fit.   I then gained femoral exposure taking care to protect the abductors and greater trochanter.  The superior capsule was incised longitudinally, staying lateral to the posterior border of the femoral neck. External rotation, extension, and adduction were applied.  A cookie cutter was used to enter the femoral canal, and then the femoral canal finder was used to confirm location.  I then sequentially broached up to a size 16.  Calcar planer was used on the femoral neck remnant.  I placed a standard offset neck and a trial bipolar construct. The hip was reduced.  Leg lengths were checked fluoroscopically.  The hip was dislocated and trial components were removed.  I placed the real stem followed by the real bipolar construct.  A single reduction maneuver was performed and the hip was reduced.  Fluoroscopy was used to confirm component position and leg lengths.  At 90 degrees of external rotation and extension, the hip was stable to an anterior directed force.   The wound was copiously irrigated with Irrisept solution and normal saline using pulse lavage.  Marcaine solution was injected into the periarticular soft tissue.  The wound was closed in layers using #1 Stratafix for the fascia, 2-0 Vicryl for the subcutaneous fat, 2-0 Monocryl for the deep dermal layer, and staples + Dermabond for the skin.  Once  the glue was fully dried, an Aquacell Ag dressing was applied.  The patient was then awakened from anesthesia and transported to the recovery room in stable condition.  Sponge, needle, and instrument counts were correct at the end of the case x2.  The patient tolerated the procedure well and there  were no known complications.  Please note that a surgical assistant was a medical necessity for this procedure to perform it in a safe and expeditious manner. Assistant was necessary to provide appropriate retraction of vital neurovascular structures, to prevent femoral fracture, and to allow for anatomic placement of the prosthesis.

## 2022-11-18 NOTE — Progress Notes (Signed)
Daughter in the room when patient arrived back.  Patient agitated and confused.  Patient has required constant reorienting.  Bed placed in lowest position, fall mats in place, telemetry box placed back on patient.  RN at bedside.

## 2022-11-18 NOTE — Progress Notes (Signed)
PROGRESS NOTE  Michaela Herrera  UKG:[id number] DOB: 1928-02-02 DOA: 11/17/2022 PCP: [name], MD   Brief Narrative: Patient is an 89 year old female with history of diabetes type 2, hypertension, chronic diastolic CHF who presented with complaint of left hip pain after ground-level mechanical fall at home.  She tripped while attempting to ambulate without  walker at home with immediate feeling of pain on left hip.  Her left hip pain was radiating to the left groin, unable to bear weight on the left lower extremity.  No history of head injury or loss of consciousness.  On presentation she was mildly hypertensive.  WBC count of 14.2.  X-ray of the left hip showed nondisplaced left femoral neck fracture.  Case was discussed with the [facility], plan for ORIF.  Assessment & Plan:  Principal Problem:   Closed left hip fracture (HCC) Active Problems:   Essential hypertension   Fall   Acute respiratory failure with hypoxia (HCC)   Leukocytosis   DM2 (diabetes mellitus, type 2) ([facility])   Chronic diastolic CHF (congestive heart failure) ([facility])   Acute left femoral neck fracture: History of fall with immediate development of pain in the left hip, and inability to bear weight.  Orthopedics consulted, plan for ORIF.  Continue supportive care, pain management.  SCD for DVT prophylaxis for now.  Chemical DVT prophylaxis after surgery.  PT/OT evaluation after surgery, may need SNF. Has history of frequent falls, last admitted on 06/29/2022 and was found to have 3 right-sided rib cage fractures after the fall.  Leukocytosis: Unclear etiology, likely reactive.  No clear evidence of underlying infectious process.  Chest x-ray did not show any pneumonia.  Procalcitonin nonreassuring.   UA showed few leukocytes, rare bacteria.  Will get urine culture.  Leukocytosis slightly worsened today.  Patient remains afebrile.  Started on ceftriaxone  Acute hypoxic respiratory failure: Does not use oxygen at home.   Required 2 L of oxygen to maintain saturation of 90 to 92%.  Chest x-ray showed low lung volumes.  Continuing incentive spirometer.  Try to wean the oxygen  Type 2 diabetes: Takes glipizide, empagliflozin, metformin.  Patient hemoglobin A1c was 6.6.  Continue sliding's insulin for now.  Continue monitoring blood sugars  Hypertension: Mildly hypertensive on presentation.  Continue as needed medications for severe hypertension.  Takes metoprolol, amlodipine, clonidine, Imdur at home.  Chronic diastolic CHF: Last echo showed EF of 65%, grade 1 diastolic function.  Takes Lasix on Monday, Wednesday, Friday.  Mildly elevated BNP.  Hypercalcemia: Unclear etiology.  Has chronic hypercalcemia.Calcium level of 12.4 today.  Will continue to monitor.  She was admitted on 06/[facility] at [facility] and she was found to have elevated calcium level.  She was recommended to follow-up with PCP and do workup including PTH, PTH RP, vitamin D level, TSH.  Donot see any work up in the system.She was treated with calcium nasal spray with improvement in the calcium level,, recommended to stop calcium supplementation. We we will check vitamin D level.  Continue calcitonin nasal spray again.  Deconditioning/advanced age/dementia: Patient is confused at baseline with time as per the daughter.  Oriented to place.  Follows commands.  CODE STATUS DNR.        DVT prophylaxis:SCDs Start: 11/17/22 2352     Code Status: DNR  Family Communication: Discussed with daughter at bedside.  Patient status:Inpatient  Patient is from :Home  Anticipated discharge JS:EGBT vs SnF  Estimated DC date:Not sure   Consultants: Orthopedics  Procedures:None yet  Antimicrobials:  Anti-infectives (From admission, onward)    None       Subjective: Patient seen and examined at bedside today.  Hemodynamically stable.Overall   Comfortable.  Mild hypertension this morning.  On 2 L of oxygen.  Does not appear in respiratory  distress.  No peripheral edema.  Lungs are almost clear to auscultation.  Denies any abdominal pain, nausea or vomiting.  Tenderness on the left hip  Objective: Vitals:   11/18/22 0045 11/18/22 0100 11/18/22 0237 11/18/22 0508  BP: 127/73 137/61 (!) 149/67 (!) 153/61  Pulse: 80 80 88 83  Resp:   20 18  Temp:   99.2 F (37.3 C) 98.4 F (36.9 C)  TempSrc:   Oral Oral  SpO2: 94% 94% 93% 90%  Weight:   54.4 kg   Height:   5\' 4"  (1.626 m)     Intake/Output Summary (Last 24 hours) at 11/18/2022 0655 Last data filed at 11/18/2022 0600 Gross per 24 hour  Intake 0 ml  Output 100 ml  Net -100 ml   Filed Weights   11/18/22 0237  Weight: 54.4 kg    Examination:  General exam: Overall comfortable, not in distress, deconditioned, pleasantly confused HEENT: PERRL Respiratory system: Diminished sounds on the bases, no wheezes or crackles  Cardiovascular system: S1 & S2 heard, RRR.  Gastrointestinal system: Abdomen is nondistended, soft and nontender. Central nervous system: Alert and awake, obeys commands, oriented to place only Extremities: No edema, no clubbing ,no cyanosis, tenderness on the left hip Skin: No rashes, no ulcers,no icterus     Data Reviewed: I have personally reviewed following labs and imaging studies  CBC: Recent Labs  Lab 11/17/22 2139 11/18/22 0342  WBC 14.2* 17.4*  NEUTROABS 10.4* 15.4*  HGB 14.3 14.2  HCT 44.5 44.8  MCV 89.4 89.8  PLT 237 350   Basic Metabolic Panel: Recent Labs  Lab 11/17/22 0009 11/18/22 0342  NA 141 143  K 4.4 4.4  CL 111 110  CO2 21* 23  GLUCOSE 189* 189*  BUN 20 21  CREATININE 1.11* 0.97  CALCIUM 11.6* 12.3*  MG 2.1 1.8  PHOS  --  2.7     Recent Results (from the past 240 hour(s))  Surgical PCR screen     Status: None   Collection Time: 11/18/22  3:56 AM   Specimen: Nasal Mucosa; Nasal Swab  Result Value Ref Range Status   MRSA, PCR NEGATIVE NEGATIVE Final   Staphylococcus aureus NEGATIVE NEGATIVE Final     Comment: (NOTE) The Xpert SA Assay (FDA approved for NASAL specimens in patients 54 years of age and older), is one component of a comprehensive surveillance program. It is not intended to diagnose infection nor to guide or monitor treatment. Performed at [facility], [place] [address], [place], [place] [address]      Radiology Studies: CT HIP LEFT WO CONTRAST  Result Date: 11/18/2022 CLINICAL DATA:  CT of the left hip to further characterize fracture. Possible surgery fall EXAM: CT OF THE LEFT HIP WITHOUT CONTRAST TECHNIQUE: Multidetector CT imaging of the left hip was performed according to the standard protocol. Multiplanar CT image reconstructions were also generated. RADIATION DOSE REDUCTION: This exam was performed according to the departmental dose-optimization program which includes automated exposure control, adjustment of the mA and/or kV according to patient size and/or use of iterative reconstruction technique. COMPARISON:  Radiographs earlier today FINDINGS: Bones/Joint/Cartilage Redemonstrated acute comminuted subcapital left femoral neck fracture the distal fragment is rotated posteriorly  with approximately 8 mm of widening about the apex of the angulation there is slight impaction of the posterior femoral neck into the femoral head. Slight lateral displacement of the distal dominant fragment. No additional fractures identified. Ligaments Suboptimally assessed by CT. Muscles and Tendons Calcifications at the origin of the left hamstring tendons. Soft tissues Mild edema about the left hip. IMPRESSION: Redemonstrated left femoral neck fracture. Electronically Signed   By: Placido Sou M.D.   On: 11/18/2022 00:25   DG Hip Unilat W or Wo Pelvis 2-3 Views Left  Result Date: 11/17/2022 CLINICAL DATA:  Fall EXAM: DG HIP (WITH OR WITHOUT PELVIS) 2-3V LEFT COMPARISON:  None Available. FINDINGS: There is a nondisplaced left femoral neck fracture. There is no dislocation. Are  mild degenerative changes of the left hip. Vascular calcifications are seen in the soft tissues. IMPRESSION: Nondisplaced left femoral neck fracture. Electronically Signed   By: Ronney Asters M.D.   On: 11/17/2022 22:19   DG Chest 1 View  Result Date: 11/17/2022 CLINICAL DATA:  Status post fall. EXAM: CHEST  1 VIEW COMPARISON:  June 27, 2022 FINDINGS: The heart size and mediastinal contours are within normal limits. There is moderate severity calcification of the aortic arch. Low lung volumes are noted. There is no evidence of an acute infiltrate, pleural effusion or pneumothorax. The visualized skeletal structures are unremarkable. IMPRESSION: Low lung volumes without evidence of acute cardiopulmonary disease. Electronically Signed   By: Virgina Norfolk M.D.   On: 11/17/2022 22:18    Scheduled Meds:  insulin aspart  0-6 Units Subcutaneous Q6H   Continuous Infusions:   LOS: 1 day   Shelly Coss, MD [facility] P1/09/2023, 6:55 AM

## 2022-11-18 NOTE — Anesthesia Procedure Notes (Signed)
Procedure Name: Intubation Date/Time: 11/18/2022 4:52 PM  Performed by: Raenette Rover, CRNAPre-anesthesia Checklist: Patient identified, Emergency Drugs available, Suction available and Patient being monitored Patient Re-evaluated:Patient Re-evaluated prior to induction Oxygen Delivery Method: Circle system utilized Preoxygenation: Pre-oxygenation with 100% oxygen Induction Type: IV induction Ventilation: Mask ventilation without difficulty Laryngoscope Size: Mac and 3 Grade View: Grade I Tube type: Oral Tube size: 7.0 mm Number of attempts: 1 Airway Equipment and Method: Stylet Placement Confirmation: ETT inserted through vocal cords under direct vision, positive ETCO2 and breath sounds checked- equal and bilateral Secured at: 21 cm Tube secured with: Tape Dental Injury: Teeth and Oropharynx as per pre-operative assessment

## 2022-11-18 NOTE — Transfer of Care (Signed)
Immediate Anesthesia Transfer of Care Note  Patient: Michaela Herrera  Procedure(s) Performed: ANTERIOR APPROACH HEMI HIP ARTHROPLASTY (Left: Hip)  Patient Location: PACU  Anesthesia Type:General  Level of Consciousness: awake, drowsy, and patient cooperative  Airway & Oxygen Therapy: Patient Spontanous Breathing and Patient connected to nasal cannula oxygen  Post-op Assessment: Report given to RN and Post -op Vital signs reviewed and stable  Post vital signs: Reviewed and stable  Last Vitals:  Vitals Value Taken Time  BP 138/99 11/18/22 1832  Temp 36.6 C 11/18/22 1832  Pulse 70 11/18/22 1833  Resp 13 11/18/22 1833  SpO2 92 % 11/18/22 1833  Vitals shown include unvalidated device data.  Last Pain:  Vitals:   11/18/22 1456  TempSrc:   PainSc: 0-No pain         Complications: No notable events documented.

## 2022-11-18 NOTE — H&P (View-Only) (Signed)
ORTHOPAEDIC CONSULTATION  REQUESTING PHYSICIAN: Shelly Coss, MD  PCP:  Dixie Dials, MD  Chief Complaint: left hip injury  HPI: Michaela Herrera is a 89 y.o. female with history of diabetes type 2, hypertension, chronic diastolic CHF, and mild dementia who presented with complaint of left hip pain after ground-level mechanical fall at home 11/17/22. She tripped while attempting to ambulate without a walker at home with immediate pain of left hip and inability to bear weight on the left lower extremity. No history of head injury or loss of consciousness. X-ray of the left hip showed left femoral neck fracture. Orthopedics was consulted.   Patient takes a baby aspirin daily. She denies any tingling or numbness in her LE bilaterally. She normally ambulates with a walker when she remembers. She lives at home with her daughter and son.   Past Medical History:  Diagnosis Date   Diabetes mellitus without complication ([place])    Diabetic retinopathy ([place])    NPDR OU   GERD (gastroesophageal reflux disease)    Hypertension    Hypertensive retinopathy    OU   Past Surgical History:  Procedure Laterality Date   APPENDECTOMY     CATARACT EXTRACTION Bilateral    COLONOSCOPY  2005   EYE SURGERY Bilateral    Cat Sx   Social History   Socioeconomic History   Marital status: Widowed    Spouse name: Not on file   Number of children: Not on file   Years of education: Not on file   Highest education level: Not on file  Occupational History   Not on file  Tobacco Use   Smoking status: Never   Smokeless tobacco: Never  Substance and Sexual Activity   Alcohol use: No   Drug use: No   Sexual activity: Not on file  Other Topics Concern   Not on file  Social History Narrative   Not on file   Social Determinants of Health   Financial Resource Strain: Not on file  Food Insecurity: No Food Insecurity (11/18/2022)   Hunger Vital Sign    Worried About Running Out of Food in the Last  Year: Never true    Ran Out of Food in the Last Year: Never true  Transportation Needs: No Transportation Needs (11/18/2022)   PRAPARE - Hydrologist (Medical): No    Lack of Transportation (Non-Medical): No  Physical Activity: Not on file  Stress: Not on file  Social Connections: Not on file   History reviewed. No pertinent family history. No Known Allergies Prior to Admission medications   Medication Sig Start Date End Date Taking? Authorizing Provider  potassium chloride (KLOR-CON) 10 MEQ tablet Take 10 mEq by mouth daily. 09/21/22  Yes [provider]  QUEtiapine (SEROQUEL) 25 MG tablet Take 25 mg by mouth daily. 11/09/22  Yes [provider]  acetaminophen (TYLENOL) 500 MG tablet Take 1 tablet (500 mg total) by mouth every 8 (eight) hours as needed for moderate pain or mild pain. 06/29/22 06/29/23  Thurnell Lose, MD  amLODipine (NORVASC) 5 MG tablet Take 5 mg by mouth 2 (two) times daily. 12/11/15   [provider]  aspirin EC 81 MG tablet Take 81 mg by mouth in the morning. Swallow whole.    [provider]  atorvastatin (LIPITOR) 10 MG tablet Take 10 mg by mouth See admin instructions. Take 10 mg by mouth at bedtime on Mon/Wed/Fri only 07/07/20   [provider]  cloNIDine (CATAPRES) 0.2 MG tablet Take 1 tablet (0.2 mg total) by mouth 3 (three) times daily. 06/29/22   Thurnell Lose, MD  Cyanocobalamin (VITAMIN B 12 PO) Take 1 tablet by mouth daily with breakfast.    [provider]  furosemide (LASIX) 20 MG tablet Take 1 tablet (20 mg total) by mouth every Monday, Wednesday, and Friday. 07/02/22   Thurnell Lose, MD  glipiZIDE (GLUCOTROL XL) 5 MG 24 hr tablet Take 1 tablet (5 mg total) by mouth daily with breakfast. Patient taking differently: Take 2.5 mg by mouth daily with breakfast. 01/23/16   Dixie Dials, MD  isosorbide mononitrate (IMDUR) 60 MG 24 hr tablet Take 60 mg by mouth daily. 12/04/15    [provider]  JANUVIA 100 MG tablet Take 50 mg by mouth daily. 10/07/20   [provider]  JARDIANCE 10 MG TABS tablet Take 10 mg by mouth daily. 06/02/22   [provider]  metFORMIN (GLUCOPHAGE) 500 MG tablet Take 500 mg by mouth 2 (two) times daily with a meal.    [provider]  metoprolol (LOPRESSOR) 50 MG tablet Take 50 mg by mouth in the morning. 12/04/15   [provider]  potassium chloride (KLOR-CON M) 10 MEQ tablet Take 1 tablet (10 mEq total) by mouth daily. 07/02/22   Thurnell Lose, MD   DG Knee Left Port  Result Date: 11/18/2022 CLINICAL DATA:  Closed displaced fracture left femoral neck. EXAM: PORTABLE LEFT KNEE - 1-2 VIEW COMPARISON:  None Available. FINDINGS: There is diffuse decreased bone mineralization. Severe lateral and moderate medial and patellofemoral compartment joint space narrowing with tricompartmental peripheral degenerative osteophytes. Tiny joint effusion. Within the limitations of diffuse decreased bone mineralization, there is minimal cortical irregularity and possible minimal 1 mm cortical step-off at the lateral aspect of the proximal metaphysis of the fibula, compatible with an acute nondisplaced fracture. However, no distinct fracture line is visible. Additional overlying lucencies and densities from the patient's overlying clothing somewhat limit evaluation of fine bony detail. Moderate vascular calcifications. IMPRESSION: 1. Minimal cortical step-off and minimal cortical irregularity of the lateral proximal aspect of the fibula ends indeterminate but could represent an acute nondisplaced fracture. Recommend clinical correlation for point tenderness. No distinct fracture line is visualized. 2. 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 Electronically Signed   By: Yvonne Kendall M.D.   On: 11/18/2022 08:25   CT HIP LEFT WO CONTRAST  Result Date: 11/18/2022 CLINICAL DATA:  CT of  the left hip to further characterize fracture. Possible surgery fall EXAM: CT OF THE LEFT HIP WITHOUT CONTRAST TECHNIQUE: Multidetector CT imaging of the left hip was performed according to the standard protocol. Multiplanar CT image reconstructions were also generated. RADIATION DOSE REDUCTION: This exam was performed according to the departmental dose-optimization program which includes automated exposure control, adjustment of the mA and/or kV according to patient size and/or use of iterative reconstruction technique. COMPARISON:  Radiographs earlier today FINDINGS: Bones/Joint/Cartilage Redemonstrated acute comminuted subcapital left femoral neck fracture the distal fragment is rotated posteriorly with approximately 8 mm of widening about the apex of the angulation there is slight impaction of the posterior femoral neck into the femoral head. Slight lateral displacement of the distal dominant fragment. No additional fractures identified. Ligaments Suboptimally assessed by CT. Muscles and Tendons Calcifications at the origin of the left hamstring tendons. Soft tissues Mild edema about the left hip. IMPRESSION: Redemonstrated left femoral neck fracture. Electronically Signed   By: Dorothea Ogle  Stutzman M.D.   On: 11/18/2022 00:25   DG Hip Unilat W or Wo Pelvis 2-3 Views Left  Result Date: 11/17/2022 CLINICAL DATA:  Fall EXAM: DG HIP (WITH OR WITHOUT PELVIS) 2-3V LEFT COMPARISON:  None Available. FINDINGS: There is a nondisplaced left femoral neck fracture. There is no dislocation. Are mild degenerative changes of the left hip. Vascular calcifications are seen in the soft tissues. IMPRESSION: Nondisplaced left femoral neck fracture. Electronically Signed   By: Amy  Guttmann M.D.   On: 11/17/2022 22:19   DG Chest 1 View  Result Date: 11/17/2022 CLINICAL DATA:  Status post fall. EXAM: CHEST  1 VIEW COMPARISON:  June 27, 2022 FINDINGS: The heart size and mediastinal contours are within normal limits. There is  moderate severity calcification of the aortic arch. Low lung volumes are noted. There is no evidence of an acute infiltrate, pleural effusion or pneumothorax. The visualized skeletal structures are unremarkable. IMPRESSION: Low lung volumes without evidence of acute cardiopulmonary disease. Electronically Signed   By: Thaddeus  Houston M.D.   On: 11/17/2022 22:18    Positive ROS: All other systems have been reviewed and were otherwise negative with the exception of those mentioned in the HPI and as above.  Physical Exam: General: Alert, no acute distress. She is oriented to name, month and day of birth.  Cardiovascular: No pedal edema Respiratory: No cyanosis, no use of accessory musculature GI: No organomegaly, abdomen is soft and non-tender Skin: No lesions in the area of chief complaint Neurologic: Sensation intact distally Lymphatic: No axillary or cervical lymphadenopathy  MUSCULOSKELETAL: Examination of the left hip reveals no skin wounds or lesions. Pain with left hip movement and palpation. Her LLE is shortened and externally rotated.   Sensory and motor function intact in LE bilaterally including plantar flexion, dorsiflexion, and EHL. Distal pedal pulses 2+ bilaterally. No significant pedal edema. Calves soft and non-tender.   Assessment: Left femoral neck fracture.   Plan:  I discussed the findings with the patient and her daughter. She has an unstable left femoral neck fracture. She will require surgical treatment for pain control and allow immediate mobilization out of bed. [facility] has already been consulted for admission and perioperative medical optimization. Plan for left hip hemiarthroplasty today. Continue to hold chemical DVT ppx. She has been NPO since midnight. All questions solicited and answered. Call/return precautions discussed.     Khadeeja Elden S Valen Mascaro, PA-C    11/18/2022 1:10 PM  

## 2022-11-18 NOTE — Discharge Instructions (Signed)
? ?Dr. Brian Swinteck ?Joint Replacement Specialist ?[facility] ?[address] ?[place], [place] [address] ?[phone] ? ? ?TOTAL HIP REPLACEMENT POSTOPERATIVE DIRECTIONS ? ? ? ?Hip Rehabilitation, Guidelines Following Surgery  ? ?WEIGHT BEARING ?Weight bearing as tolerated with assist device (walker, cane, etc) as directed, use it as long as suggested by your surgeon or therapist, typically at least 4-6 weeks. ? ?The results of a hip operation are greatly improved after range of motion and muscle strengthening exercises. Follow all safety measures which are given to protect your hip. If any of these exercises cause increased pain or swelling in your joint, decrease the amount until you are comfortable again. Then slowly increase the exercises. Call your caregiver if you have problems or questions.  ? ?HOME CARE INSTRUCTIONS  ?Most of the following instructions are designed to prevent the dislocation of your new hip.  ?Remove items at home which could result in a fall. This includes throw rugs or furniture in walking pathways.  ?Continue medications as instructed at time of discharge. ?You may have some home medications which will be placed on hold until you complete the course of blood thinner medication. ?You may start showering once you are discharged home. Do not remove your dressing. ?Do not put on socks or shoes without following the instructions of your caregivers.   ?Sit on chairs with arms. Use the chair arms to help push yourself up when arising.  ?Arrange for the use of a toilet seat elevator so you are not sitting low.  ?Walk with walker as instructed.  ?You may resume a sexual relationship in one month or when given the OK by your caregiver.  ?Use walker as long as suggested by your caregivers.  ?You may put full weight on your legs and walk as much as is comfortable. ?Avoid periods of inactivity such as sitting longer than an hour when not asleep. This helps prevent blood  clots.  ?You may return to work once you are cleared by your surgeon.  ?Do not drive a car for 6 weeks or until released by your surgeon.  ?Do not drive while taking narcotics.  ?Wear elastic stockings for two weeks following surgery during the day but you may remove then at night.  ?Make sure you keep all of your appointments after your operation with all of your doctors and caregivers. You should call the office at the above phone number and make an appointment for approximately two weeks after the date of your surgery. ?Please pick up a stool softener and laxative for home use as long as you are requiring pain medications. ?ICE to the affected hip every three hours for 30 minutes at a time and then as needed for pain and swelling. Continue to use ice on the hip for pain and swelling from surgery. You may notice swelling that will progress down to the foot and ankle.  This is normal after surgery.  Elevate the leg when you are not up walking on it.   ?It is important for you to complete the blood thinner medication as prescribed by your doctor. ?Continue to use the breathing machine which will help keep your temperature down.  It is common for your temperature to cycle up and down following surgery, especially at night when you are not up moving around and exerting yourself.  The breathing machine keeps your lungs expanded and your temperature down. ? ?RANGE OF MOTION AND STRENGTHENING EXERCISES  ?These exercises are designed to help you   keep full movement of your hip joint. Follow your caregiver's or physical therapist's instructions. Perform all exercises about fifteen times, three times per day or as directed. Exercise both hips, even if you have had only one joint replacement. These exercises can be done on a training (exercise) mat, on the floor, on a table or on a bed. Use whatever works the best and is most comfortable for you. Use music or television while you are exercising so that the exercises are a  pleasant break in your day. This will make your life better with the exercises acting as a break in routine you can look forward to.  ?Lying on your back, slowly slide your foot toward your buttocks, raising your knee up off the floor. Then slowly slide your foot back down until your leg is straight again.  ?Lying on your back spread your legs as far apart as you can without causing discomfort.  ?Lying on your side, raise your upper leg and foot straight up from the floor as far as is comfortable. Slowly lower the leg and repeat.  ?Lying on your back, tighten up the muscle in the front of your thigh (quadriceps muscles). You can do this by keeping your leg straight and trying to raise your heel off the floor. This helps strengthen the largest muscle supporting your knee.  ?Lying on your back, tighten up the muscles of your buttocks both with the legs straight and with the knee bent at a comfortable angle while keeping your heel on the floor.  ? ?SKILLED REHAB INSTRUCTIONS: ?If the patient is transferred to a skilled rehab facility following release from the hospital, a list of the current medications will be sent to the facility for the patient to continue.  When discharged from the skilled rehab facility, please have the facility set up the patient's Home Health Physical Therapy prior to being released. Also, the skilled facility will be responsible for providing the patient with their medications at time of release from the facility to include their pain medication and their blood thinner medication. If the patient is still at the rehab facility at time of the two week follow up appointment, the skilled rehab facility will also need to assist the patient in arranging follow up appointment in our office and any transportation needs. ? ?POST-OPERATIVE OPIOID TAPER INSTRUCTIONS: ?It is important to wean off of your opioid medication as soon as possible. If you do not need pain medication after your surgery it is ok  to stop day one. ?Opioids include: ?Codeine, Hydrocodone(Norco, Vicodin), Oxycodone(Percocet, oxycontin) and hydromorphone amongst others.  ?Long term and even short term use of opiods can cause: ?Increased pain response ?Dependence ?Constipation ?Depression ?Respiratory depression ?And more.  ?Withdrawal symptoms can include ?Flu like symptoms ?Nausea, vomiting ?And more ?Techniques to manage these symptoms ?Hydrate well ?Eat regular healthy meals ?Stay active ?Use relaxation techniques(deep breathing, meditating, yoga) ?Do Not substitute Alcohol to help with tapering ?If you have been on opioids for less than two weeks and do not have pain than it is ok to stop all together.  ?Plan to wean off of opioids ?This plan should start within one week post op of your joint replacement. ?Maintain the same interval or time between taking each dose and first decrease the dose.  ?Cut the total daily intake of opioids by one tablet each day ?Next start to increase the time between doses. ?The last dose that should be eliminated is the evening dose.  ? ? ?MAKE   SURE YOU:  ?Understand these instructions.  ?Will watch your condition.  ?Will get help right away if you are not doing well or get worse. ? ?Pick up stool softner and laxative for home use following surgery while on pain medications. ?Do not remove your dressing. ?The dressing is waterproof--it is OK to take showers. ?Continue to use ice for pain and swelling after surgery. ?Do not use any lotions or creams on the incision until instructed by your surgeon. ?Total Hip Protocol. ? ?

## 2022-11-18 NOTE — Progress Notes (Signed)
Patient is combative and agitated. Paged [facility] to ask for Haldol or equivalent.

## 2022-11-18 NOTE — Progress Notes (Signed)
Initial Nutrition Assessment  DOCUMENTATION CODES:   Severe malnutrition in context of chronic illness  INTERVENTION:  - Advance diet to Regular after OR.  - Recommend Ensure Plus High Protein po TID once diet advanced. Each supplement provides 350 kcal and 20 grams of protein. - Recommend daily multivitamin to support micronutrient needs.  - Monitor weight trends.    NUTRITION DIAGNOSIS:   Severe Malnutrition related to chronic illness (CHF) as evidenced by severe fat depletion, severe muscle depletion.  GOAL:   Patient will meet greater than or equal to 90% of their needs  MONITOR:   Diet advancement, Weight trends, PO intake  REASON FOR ASSESSMENT:   Malnutrition Screening Tool    ASSESSMENT:   89 year old female with history of DMII, HTN, chronic diastolic CHF who presented with complaint of left hip pain after ground-level mechanical fall at home. Found to have L femoral neck fracture.   Met with patient and niece at bedside this morning. Patient noted to be somewhat confused during conversation. MD note today indicates patient has advanced dementia. Aware she is in the hospital after a fall but did not seem to respond appropriately to nutrition related questions.   Per EMR, patient weighed at 127# in August and now weighed at 120#. This could represent a 7# or 5.5% weight loss in 5 months, which is not significant for the time frame.   Requesting water but reminded patient she is NPO for surgery today. Agreeable to nutrition supplements once diet advanced.    Medications reviewed and include: Insulin, Miralax, Senokot  Labs reviewed:  HA1C 6.6   NUTRITION - FOCUSED PHYSICAL EXAM:  Flowsheet Row Most Recent Value  Orbital Region Severe depletion  Upper Arm Region Severe depletion  Thoracic and Lumbar Region Severe depletion  Buccal Region Severe depletion  Temple Region Severe depletion  Clavicle Bone Region Severe depletion  Clavicle and Acromion Bone  Region Severe depletion  Scapular Bone Region Unable to assess  Dorsal Hand Severe depletion  Patellar Region Severe depletion  Anterior Thigh Region Severe depletion  Posterior Calf Region Severe depletion  Edema (RD Assessment) None  Hair Reviewed  Eyes Reviewed  Mouth Reviewed  Skin Reviewed  Nails Reviewed       Diet Order:   Diet Order             Diet NPO time specified Except for: Sips with Meds  Diet effective now                   EDUCATION NEEDS:  Not appropriate for education at this time  Skin:  Skin Assessment: Reviewed RN Assessment  Last BM:  1/9  Height:  Ht Readings from Last 1 Encounters:  11/18/22 5\' 4"  (1.626 m)   Weight:  Wt Readings from Last 1 Encounters:  11/18/22 54.4 kg    BMI:  Body mass index is 20.59 kg/m.  Estimated Nutritional Needs:  Kcal:  1750-1900 kcals Protein:  100-110 grams Fluid:  >/= 1.7L    Samson Frederic RD, LDN For contact information, refer to [facility].

## 2022-11-18 NOTE — Anesthesia Preprocedure Evaluation (Addendum)
Anesthesia Evaluation  Patient identified by MRN, date of birth, ID band Patient awake    Reviewed: Allergy & Precautions, NPO status , Patient's Chart, lab work & pertinent test results, reviewed documented beta blocker date and time   History of Anesthesia Complications Negative for: history of anesthetic complications  Airway Mallampati: II  TM Distance: >3 FB Neck ROM: Full    Dental  (+) Dental Advisory Given Missing several teeth. Denies loose teeth.:   Pulmonary neg pulmonary ROS   Pulmonary exam normal breath sounds clear to auscultation       Cardiovascular hypertension (amlodipine, clonidine, furosemide, ISMN, metoprolol), Pt. on medications and Pt. on home beta blockers (-) angina +CHF (diastolic)  (-) Past MI, (-) Cardiac Stents and (-) CABG + dysrhythmias (LAFB) + Valvular Problems/Murmurs MR  Rhythm:Regular Rate:Normal  HLD  TTE 12/16/2015: Study Conclusions   - Left ventricle: The cavity size was normal. There was mild    concentric hypertrophy. Systolic function was normal. The    estimated ejection fraction was in the range of 60% to 65%. Wall    motion was normal; there were no regional wall motion    abnormalities. Doppler parameters are consistent with abnormal    left ventricular relaxation (grade 1 diastolic dysfunction).  - Mitral valve: There was mild regurgitation.  - Left atrium: The atrium was moderately to severely dilated.  - Right ventricle: The cavity size was normal. Wall thickness was    mildly increased.  - Right atrium: The atrium was moderately dilated.     Neuro/Psych       Dementia negative neurological ROS     GI/Hepatic Neg liver ROS,GERD  ,,  Endo/Other  diabetes, Type 2, Oral Hypoglycemic Agents    Renal/GU negative Renal ROS     Musculoskeletal   Abdominal   Peds  Hematology leukocytosis   Anesthesia Other Findings Leukocytosis (WBC 14.2 --> 17.4)  Hypercalcemia (Ca  12.3)  Reproductive/Obstetrics                              Anesthesia Physical Anesthesia Plan  ASA: 3  Anesthesia Plan: General   Post-op Pain Management:    Induction: Intravenous  PONV Risk Score and Plan: 3 and Treatment may vary due to age or medical condition, Ondansetron, Dexamethasone and Propofol infusion  Airway Management Planned: Oral ETT  Additional Equipment:   Intra-op Plan:   Post-operative Plan: Extubation in OR  Informed Consent: I have reviewed the patients History and Physical, chart, labs and discussed the procedure including the risks, benefits and alternatives for the proposed anesthesia with the patient or authorized representative who has indicated his/her understanding and acceptance.   Patient has DNR.  Discussed DNR with patient and Suspend DNR.   Dental advisory given  Plan Discussed with: CRNA and Anesthesiologist  Anesthesia Plan Comments: (Risks of general anesthesia discussed including, but not limited to, sore throat, hoarse voice, chipped/damaged teeth, injury to vocal cords, nausea and vomiting, allergic reactions, lung infection, heart attack, stroke, and death. All questions answered.  DNR discussed with patient and daughter. They have suspended the DNR for the perioperative period. )         Anesthesia Quick Evaluation

## 2022-11-19 ENCOUNTER — Encounter (HOSPITAL_COMMUNITY): Payer: Self-pay | Admitting: Orthopedic Surgery

## 2022-11-19 DIAGNOSIS — S72002A Fracture of unspecified part of neck of left femur, initial encounter for closed fracture: Secondary | ICD-10-CM | POA: Diagnosis not present

## 2022-11-19 DIAGNOSIS — E43 Unspecified severe protein-calorie malnutrition: Secondary | ICD-10-CM | POA: Insufficient documentation

## 2022-11-19 LAB — BASIC METABOLIC PANEL
Anion gap: 12 (ref 5–15)
BUN: 25 mg/dL — ABNORMAL HIGH (ref 8–23)
CO2: 21 mmol/L — ABNORMAL LOW (ref 22–32)
Calcium: 11.8 mg/dL — ABNORMAL HIGH (ref 8.9–10.3)
Chloride: 109 mmol/L (ref 98–111)
Creatinine, Ser: 0.95 mg/dL (ref 0.44–1.00)
GFR, Estimated: 56 mL/min — ABNORMAL LOW (ref >60)
Glucose, Bld: 203 mg/dL — ABNORMAL HIGH (ref 70–99)
Potassium: 4.5 mmol/L (ref 3.5–5.1)
Sodium: 142 mmol/L (ref 135–145)

## 2022-11-19 LAB — URINE CULTURE: Culture: 50000 — AB

## 2022-11-19 LAB — CBC
HCT: 38.6 % (ref 36.0–46.0)
Hemoglobin: 12.1 g/dL (ref 12.0–15.0)
MCH: 28.7 pg (ref 26.0–34.0)
MCHC: 31.3 g/dL (ref 30.0–36.0)
MCV: 91.5 fL (ref 80.0–100.0)
Platelets: 203 10*3/uL (ref 150–400)
RBC: 4.22 MIL/uL (ref 3.87–5.11)
RDW: 13.7 % (ref 11.5–15.5)
WBC: 17.3 10*3/uL — ABNORMAL HIGH (ref 4.0–10.5)
nRBC: 0 % (ref 0.0–0.2)

## 2022-11-19 LAB — GLUCOSE, CAPILLARY
Glucose-Capillary: 187 mg/dL — ABNORMAL HIGH (ref 70–99)
Glucose-Capillary: 156 mg/dL — ABNORMAL HIGH (ref 70–99)
Glucose-Capillary: 134 mg/dL — ABNORMAL HIGH (ref 70–99)
Glucose-Capillary: 131 mg/dL — ABNORMAL HIGH (ref 70–99)

## 2022-11-19 MED ORDER — ASPIRIN 81 MG PO CHEW: 81.0000 mg | CHEWABLE_TABLET | Freq: Two times a day (BID) | ORAL | 0 refills | Status: AC

## 2022-11-19 MED ORDER — QUETIAPINE FUMARATE 25 MG PO TABS: 12.5000 mg | ORAL_TABLET | Freq: Every day | ORAL | Status: DC

## 2022-11-19 MED ORDER — FUROSEMIDE 20 MG PO TABS
20.0000 mg | ORAL_TABLET | ORAL | Status: DC
  Administered 2022-11-22: 20 mg via ORAL
  Filled 2022-11-19: qty 1

## 2022-11-19 MED ORDER — ENSURE ENLIVE PO LIQD
237.0000 mL | Freq: Three times a day (TID) | ORAL | Status: DC
  Administered 2022-11-20 – 2022-11-23 (×9): 237 mL via ORAL

## 2022-11-19 MED ORDER — ADULT MULTIVITAMIN W/MINERALS CH
1.0000 | ORAL_TABLET | Freq: Every day | ORAL | Status: DC
  Administered 2022-11-20 – 2022-11-23 (×4): 1 via ORAL
  Filled 2022-11-19 (×4): qty 1

## 2022-11-19 MED ORDER — HYDROCODONE-ACETAMINOPHEN 10-325 MG PO TABS: 0.5000 | ORAL_TABLET | ORAL | 0 refills | Status: AC | PRN

## 2022-11-19 NOTE — Progress Notes (Signed)
PT Cancellation Note  Patient Details Name: Michaela Herrera MRN: [id number] DOB: 05-09-1928   Cancelled Treatment:    Reason Eval/Treat Not Completed: Fatigue/lethargy limiting ability to participate Not arousable at this time.   Kati L Payson 11/19/2022, 1:32 PM Arlyce Dice, DPT Physical Therapist Acute [facility] Services Preferred contact method: Secure Chat Weekend Pager Only: [phone] Office: [phone]

## 2022-11-19 NOTE — Progress Notes (Signed)
OT Cancellation Note  Patient Details Name: Michaela Herrera MRN: [id number] DOB: 02/01/1928   Cancelled Treatment:    Reason Eval/Treat Not Completed: Patient's level of consciousness. Patient could not be aroused to participate in therapy evaluation. Will follow.   Laylamarie [employer]ur 11/19/2022, 1:28 PM

## 2022-11-19 NOTE — Progress Notes (Signed)
PROGRESS NOTE  Michaela Herrera  JXB:[id number] DOB: May 31, 1928 DOA: 11/17/2022 PCP: [name], MD   Brief Narrative: Patient is an 89 year old female with history of diabetes type 2, hypertension, chronic diastolic CHF who presented with complaint of left hip pain after ground-level mechanical fall at home.  She tripped while attempting to ambulate without  walker at home with immediate feeling of pain on left hip.  Her left hip pain was radiating to the left groin, unable to bear weight on the left lower extremity.  No history of head injury or loss of consciousness.  On presentation she was mildly hypertensive.  WBC count of 14.2.  X-ray of the left hip showed nondisplaced left femoral neck fracture.  Status post left hip hemiarthroplasty on 1/11.  PT/OT evaluation pending  Assessment & Plan:  Principal Problem:   Closed left hip fracture (HCC) Active Problems:   Essential hypertension   Fall   Acute respiratory failure with hypoxia (HCC)   Leukocytosis   DM2 (diabetes mellitus, type 2) (HCC)   Chronic diastolic CHF (congestive heart failure) (HCC)   Protein-calorie malnutrition, severe   Acute left femoral neck fracture: History of fall with immediate development of pain in the left hip, and inability to bear weight.  Orthopedics consulted.s/p ORIF.Mar[place] Kitchen  Started on aspirin for DVT prophylaxis.  PT/OT pending, may need SNF. Has history of frequent falls, last admitted on 06/29/2022 and was found to have 3 right-sided rib cage fractures after the fall.  Leukocytosis: Unclear etiology, likely reactive.  No clear evidence of underlying infectious process.  Chest x-ray did not show any pneumonia.  Procalcitonin nonreassuring.   UA showed few leukocytes, rare bacteria.  Will get urine culture.  Leukocytosis persist.  Patient remains afebrile.  Started on ceftriaxone,continue  Acute hypoxic respiratory failure: Does not use oxygen at home.  Required 2 L of oxygen to maintain saturation of 90  to 92%.  Chest x-ray showed low lung volumes.  Continuing incentive spirometer.  Try to wean the oxygen  Type 2 diabetes: Takes glipizide, empagliflozin, metformin.  Patient hemoglobin A1c was 6.6.  Continue sliding's insulin for now.  Continue monitoring blood sugars  Hypertension: Takes metoprolol, amlodipine, clonidine, Imdur at home.resumed.  Normotensive  Chronic diastolic CHF: Last echo showed EF of 65%, grade 1 diastolic function.  Takes Lasix on Monday, Wednesday, Friday.  Mildly elevated BNP.Lasix restarted  Hypercalcemia: Unclear etiology.  Has chronic hypercalcemia.Calcium level elevated on admission.She was admitted on 06/29/2022 at [facility] and she was found to have elevated calcium level.  She was recommended to follow-up with PCP and do workup including PTH, PTH RP, vitamin D level, TSH.  Donot see any work up in the system.She was treated with calcium nasal spray with improvement in the calcium level, recommended to stop calcium supplementation. We we will check vitamin D level.  Continue calcitonin nasal spray for now  Deconditioning/advanced age/dementia: Patient is confused at baseline with time as per the daughter.  Oriented to place.  Follows commands.  CODE STATUS DNR. She was agitated last night and was given Haldol.  Please minimize narcotics/status, continue Seroquel 12.5 mg daily at night   Nutrition Problem: Severe Malnutrition Etiology: chronic illness (CHF)    DVT prophylaxis:SCDs Start: 11/18/22 1950     Code Status: DNR  Family Communication: Discussed with daughter on phone on 1/12  Patient status:Inpatient  Patient is from :Home  Anticipated discharge ZH:YQMV vs SnF  Estimated DC date:Not sure   Consultants: Orthopedics  Procedures:None yet  Antimicrobials:  Anti-infectives (From admission, onward)    Start     Dose/Rate Route Frequency Ordered Stop   11/18/22 2300  ceFAZolin (ANCEF) IVPB 2g/100 mL premix        2 g 200 mL/hr over 30  Minutes Intravenous Every 6 hours 11/18/22 1949 11/19/22 1049   11/18/22 1445  ceFAZolin (ANCEF) IVPB 2g/100 mL premix        2 g 200 mL/hr over 30 Minutes Intravenous On call to O.R. 11/18/22 1250 11/18/22 1711   11/18/22 1100  cefTRIAXone (ROCEPHIN) 1 g in sodium chloride 0.9 % 100 mL IVPB        1 g 200 mL/hr over 30 Minutes Intravenous Every 24 hours 11/18/22 1008         Subjective: Patient seen and examined at bedside today.  She was lying in bed.  She was confused during my evaluation, appears sleepy.  She was given Haldol for agitation last night.  Does not look in any Distress.  Objective: Vitals:   11/18/22 2118 11/18/22 2142 11/19/22 0236 11/19/22 0540  BP: 123/70  (!) 159/71 (!) 125/57  Pulse: 75  90 80  Resp: 16  20 16   Temp:  97.8 F (36.6 C) 98.3 F (36.8 C) 97.9 F (36.6 C)  TempSrc:  Oral Oral Axillary  SpO2: 91%   97%  Weight:      Height:        Intake/Output Summary (Last 24 hours) at 11/19/2022 1112 Last data filed at 11/19/2022 0650 Gross per 24 hour  Intake 1242.45 ml  Output 2350 ml  Net -1107.55 ml   Filed Weights   11/18/22 0237 11/18/22 1456  Weight: 54.4 kg 54.4 kg    Examination:   General exam: sleepy/drowsy HEENT: PERRL Respiratory system:  no wheezes or crackles  Cardiovascular system: S1 & S2 heard, RRR.  Gastrointestinal system: Abdomen is nondistended, soft and nontender. Central nervous system: Not alert or oriented Extremities: No edema, no clubbing ,no cyanosis, surgical wound on the left hip Skin: No rashes, no ulcers,no icterus     Data Reviewed: I have personally reviewed following labs and imaging studies  CBC: Recent Labs  Lab 11/17/22 2139 11/18/22 0342 11/19/22 0322  WBC 14.2* 17.4* 17.3*  NEUTROABS 10.4* 15.4*  --   HGB 14.3 14.2 12.1  HCT 44.5 44.8 38.6  MCV 89.4 89.8 91.5  PLT 237 230 989   Basic Metabolic Panel: Recent Labs  Lab 11/17/22 0009 11/18/22 0342 11/19/22 0322  NA 141 143 142  K 4.4  4.4 4.5  CL 111 110 109  CO2 21* 23 21*  GLUCOSE 189* 189* 203*  BUN 20 21 25*  CREATININE 1.11* 0.97 0.95  CALCIUM 11.6* 12.3* 11.8*  MG 2.1 1.8  --   PHOS  --  2.7  --      Recent Results (from the past 240 hour(s))  Surgical PCR screen     Status: None   Collection Time: 11/18/22  3:56 AM   Specimen: Nasal Mucosa; Nasal Swab  Result Value Ref Range Status   MRSA, PCR NEGATIVE NEGATIVE Final   Staphylococcus aureus NEGATIVE NEGATIVE Final    Comment: (NOTE) The Xpert SA Assay (FDA approved for NASAL specimens in patients 53 years of age and older), is one component of a comprehensive surveillance program. It is not intended to diagnose infection nor to guide or monitor treatment. Performed at [facility], [place] [address] [place],  [address]   Urine Culture  Status: None (Preliminary result)   Collection Time: 11/18/22  6:54 AM   Specimen: Urine, Clean Catch  Result Value Ref Range Status   Specimen Description   Final    URINE, CLEAN CATCH Performed at [facility], [place] [address] [place], [place] [address]    Special Requests   Final    NONE Performed at [facility], [place] [address], [place], [place] [address]    Culture   Final    CULTURE REINCUBATED FOR BETTER GROWTH Performed at [facility], [place] [address] [place], [place] [address]    Report Status PENDING  Incomplete     Radiology Studies: Pelvis Portable  Result Date: 11/18/2022 CLINICAL DATA:  [id number] Closed displaced fracture of left femoral neck ([place]) [id number] EXAM: PORTABLE PELVIS 1-2 VIEWS COMPARISON:  11/17/2022 FINDINGS: Interval left hip arthroplasty, components project in expected location. No fracture or dislocation. Lateral skin staples and subcutaneous emphysema. Patient is osteopenic. Bony pelvis appears grossly intact. Spondylitic changes in the visualized lower lumbar spine. Iliofemoral arterial calcifications.  IMPRESSION: Left hip arthroplasty, without apparent complication. Electronically Signed   By: Lucrezia Europe M.D.   On: 11/18/2022 19:05   DG HIP UNILAT WITH PELVIS 1V LEFT  Result Date: 11/18/2022 CLINICAL DATA:  Surgery. EXAM: DG HIP (WITH OR WITHOUT PELVIS) 1V*L* COMPARISON:  Left hip x-ray 11/17/2022 FINDINGS: Intraoperative left hip. Four low resolution intraoperative spot views of the left hip were obtained. New left hip arthroplasty present in anatomic alignment. No fracture visible on the limited views. Total fluoroscopy time: 6 seconds Total radiation dose: 0.65 micro Gy IMPRESSION: Intraoperative left hip arthroplasty in anatomic alignment. Electronically Signed   By: Ronney Asters M.D.   On: 11/18/2022 18:26   DG C-Arm 1-60 Min-No Report  Result Date: 11/18/2022 Fluoroscopy was utilized by the requesting physician.  No radiographic interpretation.   DG Knee Left Port  Result Date: 11/18/2022 CLINICAL DATA:  Closed displaced fracture left femoral neck. EXAM: PORTABLE LEFT KNEE - 1-2 VIEW COMPARISON:  None Available. FINDINGS: There is diffuse decreased bone mineralization. Severe lateral and moderate medial and patellofemoral compartment joint space narrowing with tricompartmental peripheral degenerative osteophytes. Tiny joint effusion. Within the limitations of diffuse decreased bone mineralization, there is minimal cortical irregularity and possible minimal 1 mm cortical step-off at the lateral aspect of the proximal metaphysis of the fibula, compatible with an acute nondisplaced fracture. However, no distinct fracture line is visible. Additional overlying lucencies and densities from the patient's overlying clothing somewhat limit evaluation of fine bony detail. Moderate vascular calcifications. IMPRESSION: 1. Minimal cortical step-off and minimal cortical irregularity of the lateral proximal aspect of the fibula ends indeterminate but could represent an acute nondisplaced fracture. Recommend  clinical correlation for point tenderness. No distinct fracture line is visualized. 2. 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 Electronically Signed   By: Yvonne Kendall M.D.   On: 11/18/2022 08:25   CT HIP LEFT WO CONTRAST  Result Date: 11/18/2022 CLINICAL DATA:  CT of the left hip to further characterize fracture. Possible surgery fall EXAM: CT OF THE LEFT HIP WITHOUT CONTRAST TECHNIQUE: Multidetector CT imaging of the left hip was performed according to the standard protocol. Multiplanar CT image reconstructions were also generated. RADIATION DOSE REDUCTION: This exam was performed according to the departmental dose-optimization program which includes automated exposure control, adjustment of the mA and/or kV according to patient size and/or use of iterative reconstruction technique. COMPARISON:  Radiographs earlier today  FINDINGS: Bones/Joint/Cartilage Redemonstrated acute comminuted subcapital left femoral neck fracture the distal fragment is rotated posteriorly with approximately 8 mm of widening about the apex of the angulation there is slight impaction of the posterior femoral neck into the femoral head. Slight lateral displacement of the distal dominant fragment. No additional fractures identified. Ligaments Suboptimally assessed by CT. Muscles and Tendons Calcifications at the origin of the left hamstring tendons. Soft tissues Mild edema about the left hip. IMPRESSION: Redemonstrated left femoral neck fracture. Electronically Signed   By: Placido Sou M.D.   On: 11/18/2022 00:25   DG Hip Unilat W or Wo Pelvis 2-3 Views Left  Result Date: 11/17/2022 CLINICAL DATA:  Fall EXAM: DG HIP (WITH OR WITHOUT PELVIS) 2-3V LEFT COMPARISON:  None Available. FINDINGS: There is a nondisplaced left femoral neck fracture. There is no dislocation. Are mild degenerative changes of the left hip. Vascular calcifications are seen in the soft tissues. IMPRESSION: Nondisplaced  left femoral neck fracture. Electronically Signed   By: Ronney Asters M.D.   On: 11/17/2022 22:19   DG Chest 1 View  Result Date: 11/17/2022 CLINICAL DATA:  Status post fall. EXAM: CHEST  1 VIEW COMPARISON:  June 27, 2022 FINDINGS: The heart size and mediastinal contours are within normal limits. There is moderate severity calcification of the aortic arch. Low lung volumes are noted. There is no evidence of an acute infiltrate, pleural effusion or pneumothorax. The visualized skeletal structures are unremarkable. IMPRESSION: Low lung volumes without evidence of acute cardiopulmonary disease. Electronically Signed   By: Virgina Norfolk M.D.   On: 11/17/2022 22:18    Scheduled Meds:  acetaminophen  500 mg Oral Q6H   amLODipine  5 mg Oral BID   aspirin EC  325 mg Oral Q breakfast   atorvastatin  10 mg Oral Q M,W,F   calcitonin (sal[place])  1 spray Alternating Nares Daily   cloNIDine  0.2 mg Oral QHS   docusate sodium  100 mg Oral BID   feeding supplement  237 mL Oral TID BM   insulin aspart  0-6 Units Subcutaneous Q6H   isosorbide mononitrate  60 mg Oral Daily   metoprolol tartrate  50 mg Oral Daily   multivitamin with minerals  1 tablet Oral Daily   polyethylene glycol  17 g Oral Daily   QUEtiapine  12.5 mg Oral QHS   senna  1 tablet Oral BID   Continuous Infusions:  cefTRIAXone (ROCEPHIN)  IV 1 g (11/18/22 1136)   methocarbamol (ROBAXIN) IV       LOS: 2 days   Shelly Coss, MD [facility] P1/10/2023, 11:12 AM

## 2022-11-19 NOTE — Progress Notes (Signed)
    Subjective: Patient has some dementia at baseline. She is not willing to speak this morning. She is lying in bed.  No reported N/V/CP/SOB from staff.  She did have some agitation and combativeness overnight requiring mittens.   Objective:   VITALS:   Vitals:   11/18/22 2118 11/18/22 2142 11/19/22 0236 11/19/22 0540  BP: 123/70  (!) 159/71 (!) 125/57  Pulse: 75  90 80  Resp: 16  20 16   Temp:  97.8 F (36.6 C) 98.3 F (36.8 C) 97.9 F (36.6 C)  TempSrc:  Oral Oral Axillary  SpO2: 91%   97%  Weight:      Height:        Patient is lying in bed. Her eyes are open but she is not wanting to speak this morning. She does not appear to be in any distress. She has mittens on. ABD soft Neurovascular intact Intact pulses distally Dorsiflexion/Plantar flexion intact Incision: dressing C/D/I No cellulitis present Compartment soft Unable to fully assess sensation as patient not communicating this morning. She does move her foot on her own and has some response to light touch.   Lab Results  Component Value Date   WBC 17.3 (H) 11/19/2022   HGB 12.1 11/19/2022   HCT 38.6 11/19/2022   MCV 91.5 11/19/2022   PLT 203 11/19/2022   BMET    Component Value Date/Time   NA 142 11/19/2022 0322   K 4.5 11/19/2022 0322   CL 109 11/19/2022 0322   CO2 21 (L) 11/19/2022 0322   GLUCOSE 203 (H) 11/19/2022 0322   BUN 25 (H) 11/19/2022 0322   CREATININE 0.95 11/19/2022 0322   CALCIUM 11.8 (H) 11/19/2022 0322   CALCIUM 13.3 ([place]) 06/28/2022 1843   GFRNONAA 56 (L) 11/19/2022 0322     Assessment/Plan: 1 Day Post-Op   Principal Problem:   Closed left hip fracture (HCC) Active Problems:   Essential hypertension   Fall   Acute respiratory failure with hypoxia (HCC)   Leukocytosis   DM2 (diabetes mellitus, type 2) (HCC)   Chronic diastolic CHF (congestive heart failure) ([place])   WBAT with walker DVT ppx: Aspirin, SCDs, TEDS PO pain control PT/OT: PT has not seen yet. PT to come by  today.  Dispo: Patient under care of the medical team. Disposition per their recommendation. Pain medication and DVT ppx printed in chart.    Charlott Rakes, PA-C 11/19/2022, 7:30 AM   [facility]  [place] [address], [address], [place],  [address] Phone: [phone] [email or web address] Facebook  [employer]

## 2022-11-19 NOTE — Progress Notes (Signed)
Pt refused all medications, food, repositioning today.  Pt was agitated when moved or asked to do something.

## 2022-11-19 NOTE — Progress Notes (Signed)
The patient's foley catheter was removed at 16:00 and she has not urinated. Her bladder scan is 534. Messaged Raenette Rover.

## 2022-11-20 ENCOUNTER — Inpatient Hospital Stay (HOSPITAL_COMMUNITY): Payer: Medicare HMO

## 2022-11-20 DIAGNOSIS — S72002A Fracture of unspecified part of neck of left femur, initial encounter for closed fracture: Secondary | ICD-10-CM | POA: Diagnosis not present

## 2022-11-20 LAB — BASIC METABOLIC PANEL
Anion gap: 12 (ref 5–15)
BUN: 28 mg/dL — ABNORMAL HIGH (ref 8–23)
CO2: 19 mmol/L — ABNORMAL LOW (ref 22–32)
Calcium: 12.3 mg/dL — ABNORMAL HIGH (ref 8.9–10.3)
Chloride: 112 mmol/L — ABNORMAL HIGH (ref 98–111)
Creatinine, Ser: 0.91 mg/dL (ref 0.44–1.00)
GFR, Estimated: 59 mL/min — ABNORMAL LOW (ref >60)
Glucose, Bld: 200 mg/dL — ABNORMAL HIGH (ref 70–99)
Potassium: 4.2 mmol/L (ref 3.5–5.1)
Sodium: 143 mmol/L (ref 135–145)

## 2022-11-20 LAB — CBC
HCT: 40.7 % (ref 36.0–46.0)
Hemoglobin: 12.7 g/dL (ref 12.0–15.0)
MCH: 28.7 pg (ref 26.0–34.0)
MCHC: 31.2 g/dL (ref 30.0–36.0)
MCV: 92.1 fL (ref 80.0–100.0)
Platelets: 245 10*3/uL (ref 150–400)
RBC: 4.42 MIL/uL (ref 3.87–5.11)
RDW: 13.6 % (ref 11.5–15.5)
WBC: 17.8 10*3/uL — ABNORMAL HIGH (ref 4.0–10.5)
nRBC: 0 % (ref 0.0–0.2)

## 2022-11-20 LAB — GLUCOSE, CAPILLARY
Glucose-Capillary: 154 mg/dL — ABNORMAL HIGH (ref 70–99)
Glucose-Capillary: 190 mg/dL — ABNORMAL HIGH (ref 70–99)
Glucose-Capillary: 263 mg/dL — ABNORMAL HIGH (ref 70–99)
Glucose-Capillary: 278 mg/dL — ABNORMAL HIGH (ref 70–99)

## 2022-11-20 MED ORDER — PANTOPRAZOLE SODIUM 40 MG IV SOLR
40.0000 mg | Freq: Two times a day (BID) | INTRAVENOUS | Status: DC
  Administered 2022-11-20 – 2022-11-22 (×4): 40 mg via INTRAVENOUS
  Filled 2022-11-20 (×4): qty 10

## 2022-11-20 MED ORDER — SODIUM CHLORIDE 0.9 % IV SOLN: INTRAVENOUS | Status: DC

## 2022-11-20 MED ORDER — IOHEXOL 300 MG/ML  SOLN
100.0000 mL | Freq: Once | INTRAMUSCULAR | Status: AC | PRN
  Administered 2022-11-20: 100 mL via INTRAVENOUS

## 2022-11-20 NOTE — Evaluation (Signed)
Occupational Therapy Evaluation Patient Details Name: Michaela Herrera MRN: [id number] DOB: [birth year] Today's Date: 11/20/2022   History of Present Illness 89 year old female with history of diabetes type 2, hypertension, chronic diastolic CHF, osteoporosis who presented with complaint of left hip pain after ground-level mechanical fall at home.  Pt found to have Displaced Left femoral neck fracture and s/p left hip hemiarthroplasty anterior approach (no precautions) on 11/18/22.   Clinical Impression   Mrs. Michaela Herrera is a 89 year old woman s/p hip fracture repair with decreased ROM and strength of RLE, impaired balance, decreased activity tolerance and intermittent complaints of pain resulting in a sudden decline in functional mobility and ability to perform independent ADLs. On evaluation patient min assist to stand and ambulate with walker and needing max assist for LB ADLs. Patient has baseline confusion from dementia but able to follow commands. Patient will benefit from skilled OT services while in hospital to improve deficits and learn compensatory strategies as needed in order to return to PLOF.         Recommendations for follow up therapy are one component of a multi-disciplinary discharge planning process, led by the attending physician.  Recommendations may be updated based on patient status, additional functional criteria and insurance authorization.   Follow Up Recommendations  Skilled nursing-short term rehab (<3 hours/day)     Assistance Recommended at Discharge Frequent or constant Supervision/Assistance  Patient can return home with the following A little help with walking and/or transfers;A lot of help with bathing/dressing/bathroom;Assistance with cooking/housework;Direct supervision/assist for financial management;Assist for transportation;Help with stairs or ramp for entrance;Direct supervision/assist for medications management    Functional Status Assessment  Patient  has had a recent decline in their functional status and demonstrates the ability to make significant improvements in function in a reasonable and predictable amount of time.  Equipment Recommendations  None recommended by OT    Recommendations for Other Services       Precautions / Restrictions Precautions Precautions: Fall Restrictions Weight Bearing Restrictions: No LLE Weight Bearing: Weight bearing as tolerated      Mobility Bed Mobility Overal bed mobility: Needs Assistance Bed Mobility: Supine to Sit     Supine to sit: Min assist, HOB elevated     General bed mobility comments: assist for L LE, cues for self assist    Transfers Overall transfer level: Needs assistance Equipment used: Rolling walker (2 wheels) Transfers: Sit to/from Stand Sit to Stand: Min assist           General transfer comment: assist to rise, steady and control descent; verbal cues for UE and LE positioning      Balance Overall balance assessment: Needs assistance Sitting-balance support: No upper extremity supported, Feet supported Sitting balance-Leahy Scale: Fair     Standing balance support: Bilateral upper extremity supported, Reliant on assistive device for balance, During functional activity Standing balance-Leahy Scale: Poor                             ADL either performed or assessed with clinical judgement   ADL Overall ADL's : Needs assistance/impaired Eating/Feeding: Set up;Sitting   Grooming: Set up;Sitting   Upper Body Bathing: Set up;Supervision/ safety;Sitting   Lower Body Bathing: Maximal assistance;Sit to/from stand   Upper Body Dressing : Minimal assistance;Sitting   Lower Body Dressing: Sit to/from stand;Maximal assistance   Toilet Transfer: Minimal assistance;BSC/3in1;Rolling walker (2 wheels)   Toileting- Clothing Manipulation and Hygiene: Maximal assistance;Sit  to/from stand       Functional mobility during ADLs: Minimal  assistance;Rolling walker (2 wheels)       Vision Patient Visual Report: No change from baseline       Perception     Praxis      Pertinent Vitals/Pain Pain Assessment Pain Assessment: Faces Faces Pain Scale: Hurts little more Pain Location: left hip/thigh Pain Descriptors / Indicators: Aching, Sore, Guarding, Grimacing Pain Intervention(s): Monitored during session, Repositioned     Hand Dominance Right   Extremity/Trunk Assessment Upper Extremity Assessment Upper Extremity Assessment: Overall WFL for tasks assessed   Lower Extremity Assessment Lower Extremity Assessment: Defer to PT evaluation LLE Deficits / Details: resting in hip internal rotation at rest, required assist for bed mobility, anticipated post op hip weakness observed   Cervical / Trunk Assessment Cervical / Trunk Assessment: Normal   Communication Communication Communication: HOH   Cognition Arousal/Alertness: Awake/alert Behavior During Therapy: WFL for tasks assessed/performed Overall Cognitive Status: History of cognitive impairments - at baseline                                 General Comments: hx dementia     General Comments       Exercises     Shoulder Instructions      Home Living   Living Arrangements: Children   Type of Home: House Home Access: Stairs to enter;Ramped entrance     Home Layout: One level               Home Equipment: BSC/3in1;Wheelchair - manual;Cane - single point;Rollator (4 wheels)          Prior Functioning/Environment Prior Level of Function : Needs assist             Mobility Comments: pt is supposed to use rollator for ambulating however does forget at times per daughter (was not using assistive device when she fall leading to this admission) ADLs Comments: daughter assists with washing her back and sometimes with socks and shoes        OT Problem List: Decreased strength;Decreased range of motion;Decreased activity  tolerance;Impaired balance (sitting and/or standing);Decreased cognition;Decreased knowledge of use of DME or AE;Decreased safety awareness;Pain      OT Treatment/Interventions: Self-care/ADL training;Therapeutic exercise;DME and/or AE instruction;Therapeutic activities;Balance training;Patient/family education    OT Goals(Current goals can be found in the care plan section) Acute Rehab OT Goals OT Goal Formulation: Patient unable to participate in goal setting Time For Goal Achievement: 12/04/22 Potential to Achieve Goals: Good  OT Frequency: Min 2X/week    Co-evaluation PT/OT/SLP Co-Evaluation/Treatment: Yes (coeval) Reason for Co-Treatment: To address functional/ADL transfers;Necessary to address cognition/behavior during functional activity PT goals addressed during session: Mobility/safety with mobility OT goals addressed during session: ADL's and self-care      AM-PAC OT "6 Clicks" Daily Activity     Outcome Measure Help from another person eating meals?: A Little Help from another person taking care of personal grooming?: A Little Help from another person toileting, which includes using toliet, bedpan, or urinal?: A Lot Help from another person bathing (including washing, rinsing, drying)?: A Lot Help from another person to put on and taking off regular upper body clothing?: A Little Help from another person to put on and taking off regular lower body clothing?: A Lot 6 Click Score: 15   End of Session Equipment Utilized During Treatment: Rolling walker (2 wheels) Nurse Communication: Mobility  status  Activity Tolerance: Patient tolerated treatment well Patient left: in chair;with call bell/phone within reach;with chair alarm set;with family/visitor present  OT Visit Diagnosis: Other abnormalities of gait and mobility (R26.89);History of falling (Z91.81)                Time: 7672-0947 OT Time Calculation (min): 22 min Charges:  OT General Charges $OT Visit: 1 Visit OT  Evaluation $OT Eval Low Complexity: 1 Low  Gustavo Lah, OTR/L [place]  Office [phone]   Lenward Chancellor 11/20/2022, 1:25 PM

## 2022-11-20 NOTE — Progress Notes (Signed)
    Subjective:  Patient has some dementia at baseline. Sleeping upon entering room, woken up for exam. She knows she is in the hospital this morning. She does not report any pain. She was able to drink some water.   No reported N/V/CP/SOB from staff.  Patient able to ambulate with staff and use bedside commode this morning.   Objective:   VITALS:   Vitals:   11/20/22 0216 11/20/22 0225 11/20/22 0500 11/20/22 0506  BP:  (!) 158/71  (!) 155/67  Pulse: 88 (!) 103  99  Resp:  16  17  Temp:  97.8 F (36.6 C)  (!) 97.2 F (36.2 C)  TempSrc:  Oral    SpO2:  94%  100%  Weight:   55 kg   Height:        Patient alert, oriented to self and hospital. NAD.  ABD soft Neurovascular intact Sensation intact distally Intact pulses distally Dorsiflexion/Plantar flexion intact Incision: dressing C/D/I No cellulitis present Compartment soft   Lab Results  Component Value Date   WBC 17.8 (H) 11/20/2022   HGB 12.7 11/20/2022   HCT 40.7 11/20/2022   MCV 92.1 11/20/2022   PLT 245 11/20/2022   BMET    Component Value Date/Time   NA 143 11/20/2022 0523   K 4.2 11/20/2022 0523   CL 112 (H) 11/20/2022 0523   CO2 19 (L) 11/20/2022 0523   GLUCOSE 200 (H) 11/20/2022 0523   BUN 28 (H) 11/20/2022 0523   CREATININE 0.91 11/20/2022 0523   CALCIUM 12.3 (H) 11/20/2022 0523   CALCIUM 13.3 ([place]) 06/28/2022 1843   GFRNONAA 59 (L) 11/20/2022 0523     Assessment/Plan: 2 Days Post-Op   Principal Problem:   Closed left hip fracture (HCC) Active Problems:   Essential hypertension   Fall   Acute respiratory failure with hypoxia (HCC)   Leukocytosis   DM2 (diabetes mellitus, type 2) (HCC)   Chronic diastolic CHF (congestive heart failure) (HCC)   Protein-calorie malnutrition, severe   WBAT with walker DVT ppx: Aspirin, SCDs, TEDS PO pain control PT/OT: PT was deferred yesterday due to patient status per notes. PT to come by today.  Dispo: Patient under care of the medical team.  Disposition per their recommendation. Pain medication and DVT ppx printed in chart.    Charlott Rakes, PA-C 11/20/2022, 8:40 AM   [facility]  [place] [address], [place], [place] [address] Phone: [phone] [email or web address] Facebook  [employer]

## 2022-11-20 NOTE — Plan of Care (Signed)
  Problem: Pain Managment: Goal: General experience of comfort will improve Outcome: Progressing

## 2022-11-20 NOTE — NC FL2 (Signed)
[place] LEVEL OF CARE FORM     IDENTIFICATION  Patient Name: Michaela Herrera Birthdate: 05/01/1928 Sex: female Admission Date (Current Location): 11/17/2022  [facility] and [place] Number:  Michaela Herrera:  [facility],  [place] [address], [place]      Provider Number: [id number]  Attending Physician Name and Herrera:  Shelly Coss, MD  Relative Name and Phone Number:  Family son and daughter [phone]    Current Level of Care: Hospital Recommended Level of Care: [place] Prior Approval Number:    Date Approved/Denied: 11/20/22 PASRR Number: [id number]  Discharge Plan: SNF    Current Diagnoses: Patient Active Problem List   Diagnosis Date Noted   Protein-calorie malnutrition, severe 11/19/2022   Acute respiratory failure with hypoxia ([place]) 11/18/2022   Leukocytosis 11/18/2022   DM2 (diabetes mellitus, type 2) ([place]) 11/18/2022   Chronic diastolic CHF (congestive heart failure) ([place]) 11/18/2022   Closed left hip fracture ([place]) 11/17/2022   Hypercalcemia 06/28/2022   Fall 06/28/2022   Rib fracture 06/28/2022   Multiple fractures of ribs, right side, initial encounter for closed fracture 06/27/2022   Syncope 12/15/2015   Diabetes mellitus due to underlying condition with hyperglycemia, without long-term current use of insulin ([place]) 12/15/2015   Essential hypertension 12/15/2015    Orientation RESPIRATION BLADDER Height & Weight     Self, Time, Situation, Place  Normal Continent Weight: 121 lb 4.1 oz (55 kg) Height:  5\' 4"  (162.6 cm)  BEHAVIORAL SYMPTOMS/MOOD NEUROLOGICAL BOWEL NUTRITION STATUS     (N/A) Continent Diet (Carb modified)  AMBULATORY STATUS COMMUNICATION OF NEEDS Skin   Limited Assist Verbally Normal                       Personal Care Assistance Level of Assistance  Bathing, Feeding, Dressing Bathing Assistance: Limited assistance Feeding assistance:  Independent Dressing Assistance: Limited assistance     Functional Limitations Info  Sight Sight Info: Adequate Hearing Info: Adequate Speech Info: Adequate    SPECIAL CARE FACTORS FREQUENCY  OT (By licensed OT), PT (By licensed PT)     PT Frequency: x4 OT Frequency: x4            Contractures Contractures Info: Not present    Additional Factors Info  Code Status, Allergies Code Status Info: DNR Allergies Info: NONE Psychotropic Info: Seroquel Insulin Sliding Scale Info: See discharge summary       Current Medications (11/20/2022):  This is the current hospital active medication list Current Facility-Administered Medications  Medication Dose Route Frequency Provider Last Rate Last Admin   0.9 %  sodium chloride infusion   Intravenous Continuous Shelly Coss, MD 75 mL/hr at 11/20/22 1213 New Bag at 11/20/22 1213   acetaminophen (TYLENOL) tablet 325-650 mg  325-650 mg Oral Q6H PRN Swinteck, Aaron Edelman, MD       amLODipine (NORVASC) tablet 5 mg  5 mg Oral BID Rod Can, MD   5 mg at 11/20/22 1104   aspirin EC tablet 325 mg  325 mg Oral Q breakfast Swinteck, Aaron Edelman, MD   325 mg at 11/20/22 1103   atorvastatin (LIPITOR) tablet 10 mg  10 mg Oral Q M,W,F Swinteck, Aaron Edelman, MD       calcitonin (salmon) (MIACALCIN/FORTICAL) nasal spray 1 spray  1 spray Alternating Nares Daily Swinteck, Aaron Edelman, MD       cefTRIAXone (ROCEPHIN) 1 g in sodium chloride 0.9 % 100 mL IVPB  1 g Intravenous  Q24H Rod Can, MD 200 mL/hr at 11/20/22 1214 1 g at 11/20/22 1214   cloNIDine (CATAPRES) tablet 0.2 mg  0.2 mg Oral QHS Swinteck, Aaron Edelman, MD   0.2 mg at 11/18/22 2110   docusate sodium (COLACE) capsule 100 mg  100 mg Oral BID Rod Can, MD   100 mg at 11/20/22 1104   feeding supplement (ENSURE ENLIVE / ENSURE PLUS) liquid 237 mL  237 mL Oral TID BM Shelly Coss, MD   237 mL at 11/20/22 1321   furosemide (LASIX) tablet 20 mg  20 mg Oral Q M,W,F Adhikari, Amrit, MD       hydrALAZINE  (APRESOLINE) injection 10 mg  10 mg Intravenous Q6H PRN Swinteck, Aaron Edelman, MD       HYDROcodone-acetaminophen (NORCO) 7.5-325 MG per tablet 1-2 tablet  1-2 tablet Oral Q4H PRN Shelly Coss, MD       insulin aspart (novoLOG) injection 0-6 Units  0-6 Units Subcutaneous Q6H Swinteck, Brian, MD   1 Units at 11/20/22 1320   isosorbide mononitrate (IMDUR) 24 hr tablet 60 mg  60 mg Oral Daily Swinteck, Aaron Edelman, MD   60 mg at 11/20/22 1103   lip balm (CARMEX) ointment   Topical PRN Swinteck, Aaron Edelman, MD       menthol-cetylpyridinium (CEPACOL) lozenge 3 mg  1 lozenge Oral PRN Swinteck, Aaron Edelman, MD       Or   phenol (CHLORASEPTIC) mouth spray 1 spray  1 spray Mouth/Throat PRN Swinteck, Aaron Edelman, MD       methocarbamol (ROBAXIN) tablet 500 mg  500 mg Oral Q6H PRN Swinteck, Aaron Edelman, MD   500 mg at 11/18/22 2110   Or   methocarbamol (ROBAXIN) 500 mg in dextrose 5 % 50 mL IVPB  500 mg Intravenous Q6H PRN Swinteck, Aaron Edelman, MD       metoCLOPramide (REGLAN) tablet 5-10 mg  5-10 mg Oral Q8H PRN Swinteck, Aaron Edelman, MD       metoprolol tartrate (LOPRESSOR) tablet 50 mg  50 mg Oral Daily Swinteck, Aaron Edelman, MD   50 mg at 11/20/22 1104   morphine (PF) 2 MG/ML injection 0.5-1 mg  0.5-1 mg Intravenous Q2H PRN Swinteck, Aaron Edelman, MD       multivitamin with minerals tablet 1 tablet  1 tablet Oral Daily Shelly Coss, MD   1 tablet at 11/20/22 1103   naloxone (NARCAN) injection 0.4 mg  0.4 mg Intravenous PRN Swinteck, Aaron Edelman, MD       ondansetron (ZOFRAN) tablet 4 mg  4 mg Oral Q6H PRN Swinteck, Aaron Edelman, MD       Or   ondansetron (ZOFRAN) injection 4 mg  4 mg Intravenous Q6H PRN Swinteck, Aaron Edelman, MD       polyethylene glycol (MIRALAX / GLYCOLAX) packet 17 g  17 g Oral Daily Rod Can, MD   17 g at 11/20/22 1107   senna (SENOKOT) tablet 8.6 mg  1 tablet Oral BID Rod Can, MD   8.6 mg at 11/20/22 1104     Discharge Medications: Please see discharge summary for a list of discharge medications.  Relevant Imaging  Results:  Relevant Lab Results:   Additional Information [place] Michaela Affinito LCSW-A patient's SSI # [id number]  Michaela Herrera, [name]

## 2022-11-20 NOTE — Progress Notes (Signed)
PROGRESS NOTE  Michaela Herrera  KGM:[id number] DOB: [birth year] DOA: 11/17/2022 PCP: [name], MD   Brief Narrative: Patient is an 89 year old female with history of diabetes type 2, hypertension, chronic diastolic CHF who presented with complaint of left hip pain after ground-level mechanical fall at home.  She tripped while attempting to ambulate without  walker at home with immediate feeling of pain on left hip.  Her left hip pain was radiating to the left groin, unable to bear weight on the left lower extremity.  No history of head injury or loss of consciousness.  On presentation she was mildly hypertensive.  WBC count of 14.2.  X-ray of the left hip showed nondisplaced left femoral neck fracture.  Status post left hip hemiarthroplasty on 1/11.  PT/OT evaluation pending  Assessment & Plan:  Principal Problem:   Closed left hip fracture (HCC) Active Problems:   Essential hypertension   Fall   Acute respiratory failure with hypoxia (HCC)   Leukocytosis   DM2 (diabetes mellitus, type 2) (HCC)   Chronic diastolic CHF (congestive heart failure) (HCC)   Protein-calorie malnutrition, severe   Acute left femoral neck fracture: History of fall with immediate development of pain in the left hip, and inability to bear weight.  Orthopedics consulted.s/p ORIF.Marland Kitchen  Started on aspirin for DVT prophylaxis.  PT/OT pending, may need SNF. Has history of frequent falls, last admitted on 06/29/2022 and was found to have 3 right-sided rib cage fractures after the fall. Patient remains confused, sleepy/drowsy prolonging the PT evaluation  Leukocytosis: Unclear etiology, likely reactive.  No clear evidence of underlying infectious process.  Chest x-ray did not show any pneumonia.  Procalcitonin nonreassuring.   UA showed few leukocytes, rare bacteria.  Urine culture showed diphtheroids.  Leukocytosis persist.  Patient remains afebrile.  Started on ceftriaxone,continue.  Will do CT chest/abdomen/pelvis with  contrast  to evaluate for this leukocytosis  Acute hypoxic respiratory failure: Does not use oxygen at home.  Required 2 L of oxygen to maintain saturation of 90 to 92%.  Chest x-ray showed low lung volumes.  Continuing incentive spirometer.  Try to wean the oxygen, checking CT chest  Type 2 diabetes: Takes glipizide, empagliflozin, metformin.  Patient hemoglobin A1c was 6.6.  Continue sliding's insulin for now.  Continue monitoring blood sugars  Hypertension: Takes metoprolol, amlodipine, clonidine, Imdur at home.resumed.  Normotensive  Chronic diastolic CHF: Last echo showed EF of 65%, grade 1 diastolic function.  Takes Lasix on Monday, Wednesday, Friday.  Mildly elevated BNP.Lasix restarted  Hypercalcemia: Unclear etiology.  Has chronic hypercalcemia.Calcium level elevated on admission.She was admitted on 06/29/2022 at [facility] and she was found to have elevated calcium level.  She was recommended to follow-up with PCP and do workup including PTH, PTH RP, vitamin D level, TSH.  Donot see any work up in the system.She was treated with calcium nasal spray with improvement in the calcium level, recommended to stop calcium supplementation. Pending vitamin D level.  Continue calcitonin nasal spray for now. Follow-up  pan CT  Deconditioning/advanced age/dementia: Patient is confused at baseline with time as per the daughter.  Oriented to place.  Follows commands.  CODE STATUS DNR. She was agitated on night of 1/11  and was given Haldol.  Please minimize narcotics/status, will discontinue Seroquel also.  Remains sleepy/drowsy   Nutrition Problem: Severe Malnutrition Etiology: chronic illness (CHF)    DVT prophylaxis:SCDs Start: 11/18/22 1950     Code Status: DNR  Family Communication: Discussed with daughter on phone  on 1/12  Patient status:Inpatient  Patient is from :Home  Anticipated discharge to:SnF  Estimated DC date:Not sure, pending PT/OT evaluation   Consultants:  Orthopedics  Procedures: ORIF Antimicrobials:  Anti-infectives (From admission, onward)    Start     Dose/Rate Route Frequency Ordered Stop   11/18/22 2300  ceFAZolin (ANCEF) IVPB 2g/100 mL premix        2 g 200 mL/hr over 30 Minutes Intravenous Every 6 hours 11/18/22 1949 11/19/22 1049   11/18/22 1445  ceFAZolin (ANCEF) IVPB 2g/100 mL premix        2 g 200 mL/hr over 30 Minutes Intravenous On call to O.R. 11/18/22 1250 11/18/22 1711   11/18/22 1100  cefTRIAXone (ROCEPHIN) 1 g in sodium chloride 0.9 % 100 mL IVPB        1 g 200 mL/hr over 30 Minutes Intravenous Every 24 hours 11/18/22 1008         Subjective: Patient seen and examined at bedside today.  Hemodynamically stable.  Remains very weak, deconditioned, lying in bed, confused, sleepy/drowsy.  Does not communicate today  Objective: Vitals:   11/20/22 0225 11/20/22 0500 11/20/22 0506 11/20/22 0949  BP: (!) 158/71  (!) 155/67 (!) 160/77  Pulse: (!) 103  99 99  Resp: 16  17 16   Temp: 97.8 F (36.6 C)  (!) 97.2 F (36.2 C) 98 F (36.7 C)  TempSrc: Oral   Axillary  SpO2: 94%  100% 99%  Weight:  55 kg    Height:        Intake/Output Summary (Last 24 hours) at 11/20/2022 1111 Last data filed at 11/20/2022 0445 Gross per 24 hour  Intake 200 ml  Output 1550 ml  Net -1350 ml   Filed Weights   11/18/22 0237 11/18/22 1456 11/20/22 0500  Weight: 54.4 kg 54.4 kg 55 kg    Examination:   General exam: Sleepy/drowsy, deconditioned, weak HEENT: PERRL Respiratory system:  no wheezes or crackles  Cardiovascular system: S1 & S2 heard, RRR.  Gastrointestinal system: Abdomen is nondistended, soft and nontender. Central nervous system: Not alert or  oriented Extremities: No edema, no clubbing ,no cyanosis, surgical wound on the left hip Skin: No rashes, no ulcers,no icterus    Data Reviewed: I have personally reviewed following labs and imaging studies  CBC: Recent Labs  Lab 11/17/22 2139 11/18/22 0342 11/19/22 0322  11/20/22 0523  WBC 14.2* 17.4* 17.3* 17.8*  NEUTROABS 10.4* 15.4*  --   --   HGB 14.3 14.2 12.1 12.7  HCT 44.5 44.8 38.6 40.7  MCV 89.4 89.8 91.5 92.1  PLT 237 230 203 671   Basic Metabolic Panel: Recent Labs  Lab 11/17/22 0009 11/18/22 0342 11/19/22 0322 11/20/22 0523  NA 141 143 142 143  K 4.4 4.4 4.5 4.2  CL 111 110 109 112*  CO2 21* 23 21* 19*  GLUCOSE 189* 189* 203* 200*  BUN 20 21 25* 28*  CREATININE 1.11* 0.97 0.95 0.91  CALCIUM 11.6* 12.3* 11.8* 12.3*  MG 2.1 1.8  --   --   PHOS  --  2.7  --   --      Recent Results (from the past 240 hour(s))  Surgical PCR screen     Status: None   Collection Time: 11/18/22  3:56 AM   Specimen: Nasal Mucosa; Nasal Swab  Result Value Ref Range Status   MRSA, PCR NEGATIVE NEGATIVE Final   Staphylococcus aureus NEGATIVE NEGATIVE Final    Comment: (NOTE) The Xpert SA  Assay (FDA approved for NASAL specimens in patients 55 years of age and older), is one component of a comprehensive surveillance program. It is not intended to diagnose infection nor to guide or monitor treatment. Performed at [facility], [place] [address], [place], [place] [address]   Urine Culture     Status: Abnormal   Collection Time: 11/18/22  6:54 AM   Specimen: Urine, Clean Catch  Result Value Ref Range Status   Specimen Description   Final    URINE, CLEAN CATCH Performed at [facility], [place] [address] [place], [place] [address]    Special Requests   Final    NONE Performed at [facility], [place] [address], [place],  [address]    Culture (A)  Final    50,000 COLONIES/mL DIPHTHEROIDS(CORYNEBACTERIUM SPECIES) Standardized susceptibility testing for this organism is not available. Performed at [facility], [place] [address] [place],  [address]    Report Status 11/19/2022 FINAL  Final     Radiology Studies: Pelvis Portable  Result Date: 11/18/2022 CLINICAL DATA:  [id number]  Closed displaced fracture of left femoral neck ([place]) [id number] EXAM: PORTABLE PELVIS 1-2 VIEWS COMPARISON:  11/17/2022 FINDINGS: Interval left hip arthroplasty, components project in expected location. No fracture or dislocation. Lateral skin staples and subcutaneous emphysema. Patient is osteopenic. Bony pelvis appears grossly intact. Spondylitic changes in the visualized lower lumbar spine. Iliofemoral arterial calcifications. IMPRESSION: Left hip arthroplasty, without apparent complication. Electronically Signed   By: Lucrezia Europe M.D.   On: 11/18/2022 19:05   DG HIP UNILAT WITH PELVIS 1V LEFT  Result Date: 11/18/2022 CLINICAL DATA:  Surgery. EXAM: DG HIP (WITH OR WITHOUT PELVIS) 1V*L* COMPARISON:  Left hip x-ray 11/17/2022 FINDINGS: Intraoperative left hip. Four low resolution intraoperative spot views of the left hip were obtained. New left hip arthroplasty present in anatomic alignment. No fracture visible on the limited views. Total fluoroscopy time: 6 seconds Total radiation dose: 0.65 micro Gy IMPRESSION: Intraoperative left hip arthroplasty in anatomic alignment. Electronically Signed   By: Ronney Asters M.D.   On: 11/18/2022 18:26   DG C-Arm 1-60 Min-No Report  Result Date: 11/18/2022 Fluoroscopy was utilized by the requesting physician.  No radiographic interpretation.    Scheduled Meds:  amLODipine  5 mg Oral BID   aspirin EC  325 mg Oral Q breakfast   atorvastatin  10 mg Oral Q M,W,F   calcitonin (salmon)  1 spray Alternating Nares Daily   cloNIDine  0.2 mg Oral QHS   docusate sodium  100 mg Oral BID   feeding supplement  237 mL Oral TID BM   furosemide  20 mg Oral Q M,W,F   insulin aspart  0-6 Units Subcutaneous Q6H   isosorbide mononitrate  60 mg Oral Daily   metoprolol tartrate  50 mg Oral Daily   multivitamin with minerals  1 tablet Oral Daily   polyethylene glycol  17 g Oral Daily   senna  1 tablet Oral BID   Continuous Infusions:  cefTRIAXone (ROCEPHIN)  IV 1 g (11/19/22  1220)   methocarbamol (ROBAXIN) IV       LOS: 3 days   Shelly Coss, MD [facility] P1/13/2024, 11:11 AM

## 2022-11-20 NOTE — TOC Progression Note (Signed)
Transition of Care [facility]) - Progression Note    Patient Details  Name: Michaela Herrera MRN: [id number] Date of Birth: 1928-08-16  Transition [facility] CM/SW Contact  Rodney Booze, LCSW Phone Number: 11/20/2022, 1:57 PM  Clinical Narrative:    CSW has spoke to the patient's son to inform him that his mother was being sent out to SNF. Son was made aware that will contact them for bed offers. FL2 was competed/ sent off. TOC will continue to follow.      Barriers to Discharge: Continued Medical Work up  Expected Discharge Plan and Services                                               Social Determinants of Health (SDOH) Interventions SDOH Screenings   Food Insecurity: No Food Insecurity (11/18/2022)  Housing: Low Risk  (11/18/2022)  Transportation Needs: No Transportation Needs (11/18/2022)  Utilities: Not At Risk (11/18/2022)  Tobacco Use: Low Risk  (11/19/2022)    Readmission Risk Interventions     No data to display

## 2022-11-20 NOTE — Progress Notes (Signed)
Patient alert, cooperative this morning. Patient got  up to the bedside commode and voided 400 mL.

## 2022-11-20 NOTE — Evaluation (Signed)
Physical Therapy Evaluation Patient Details Name: Michaela Herrera MRN: [id number] DOB: 01/16/1928 Today's Date: 11/20/2022  History of Present Illness  89 year old female with history of diabetes type 2, hypertension, chronic diastolic CHF, osteoporosis who presented with complaint of left hip pain after ground-level mechanical fall at home.  Pt found to have Displaced Left femoral neck fracture and s/p left hip hemiarthroplasty anterior approach (no precautions) on 11/18/22.  Clinical Impression  Patient is s/p above surgery resulting in functional limitations due to the deficits listed below (see PT Problem List).  Patient will benefit from skilled PT to increase their independence and safety with mobility to allow discharge to the venue listed below.   Pt admitted from home with daughter and typically ambulatory with rollator at baseline.  Pt pleasant and cooperative today.  Pt assisted with ambulating short distance and currently requiring min assist.  Daughter into room end of session and agreeable to SNF upon d/c.         Recommendations for follow up therapy are one component of a multi-disciplinary discharge planning process, led by the attending physician.  Recommendations may be updated based on patient status, additional functional criteria and insurance authorization.  Follow Up Recommendations Skilled nursing-short term rehab (<3 hours/day) Can patient physically be transported by private vehicle: No    Assistance Recommended at Discharge Frequent or constant Supervision/Assistance  Patient can return home with the following  A little help with walking and/or transfers;A little help with bathing/dressing/bathroom;Help with stairs or ramp for entrance;Assistance with cooking/housework;Assist for transportation    Equipment Recommendations Rolling walker (2 wheels)  Recommendations for Other Services       Functional Status Assessment Patient has had a recent decline in their  functional status and demonstrates the ability to make significant improvements in function in a reasonable and predictable amount of time.     Precautions / Restrictions Precautions Precautions: Fall Restrictions LLE Weight Bearing: Weight bearing as tolerated      Mobility  Bed Mobility Overal bed mobility: Needs Assistance Bed Mobility: Supine to Sit     Supine to sit: Min assist, HOB elevated     General bed mobility comments: assist for L LE, cues for self assist    Transfers Overall transfer level: Needs assistance Equipment used: Rolling walker (2 wheels) Transfers: Sit to/from Stand Sit to Stand: Min assist           General transfer comment: assist to rise, steady and control descent; verbal cues for UE and LE positioning    Ambulation/Gait Ambulation/Gait assistance: Min assist Gait Distance (Feet): 12 Feet Assistive device: Rolling walker (2 wheels) Gait Pattern/deviations: Step-through pattern, Antalgic, Decreased stance time - left, Decreased step length - right Gait velocity: decr     General Gait Details: verbal cues for sequence, RW positioning, posture, step length; recliner following for safety; pt fatigued quickly  Stairs            Wheelchair Mobility    Modified Rankin (Stroke Patients Only)       Balance Overall balance assessment: History of Falls, Needs assistance         Standing balance support: Bilateral upper extremity supported, Reliant on assistive device for balance, During functional activity Standing balance-Leahy Scale: Poor                               Pertinent Vitals/Pain Pain Assessment Pain Assessment: Faces Faces Pain Scale: Hurts little more  Pain Location: left hip/thigh Pain Descriptors / Indicators: Aching, Sore, Guarding, Grimacing Pain Intervention(s): Repositioned, Monitored during session (no pain at rest)    Home Living   Living Arrangements: Children   Type of Home:  House Home Access: Stairs to enter;Ramped entrance       Home Layout: One level Home Equipment: BSC/3in1;Wheelchair - manual;Cane - single point;Rollator (4 wheels)      Prior Function Prior Level of Function : Needs assist             Mobility Comments: pt is supposed to use rollator for ambulating however does forget at times per daughter (was not using assistive device when she fall leading to this admission) ADLs Comments: daughter assists with washing her back and sometimes with socks and shoes     Hand Dominance        Extremity/Trunk Assessment        Lower Extremity Assessment Lower Extremity Assessment: LLE deficits/detail;Generalized weakness LLE Deficits / Details: resting in hip internal rotation at rest, required assist for bed mobility, anticipated post op hip weakness observed       Communication   Communication: HOH  Cognition Arousal/Alertness: Awake/alert (was sleeping on arrival but once awake was also alert) Behavior During Therapy: WFL for tasks assessed/performed Overall Cognitive Status: History of cognitive impairments - at baseline                                 General Comments: hx dementia        General Comments      Exercises     Assessment/Plan    PT Assessment Patient needs continued PT services  PT Problem List Decreased strength;Pain;Decreased activity tolerance;Decreased knowledge of use of DME;Decreased balance;Decreased mobility;Decreased safety awareness       PT Treatment Interventions DME instruction;Gait training;Balance training;Therapeutic exercise;Functional mobility training;Therapeutic activities;Patient/family education    PT Goals (Current goals can be found in the Care Plan section)  Acute Rehab PT Goals PT Goal Formulation: With patient/family Time For Goal Achievement: 12/04/22 Potential to Achieve Goals: Good    Frequency Min 3X/week     Co-evaluation PT/OT/SLP  Co-Evaluation/Treatment: Yes Reason for Co-Treatment: To address functional/ADL transfers;Necessary to address cognition/behavior during functional activity PT goals addressed during session: Mobility/safety with mobility OT goals addressed during session: ADL's and self-care       AM-PAC PT "6 Clicks" Mobility  Outcome Measure Help needed turning from your back to your side while in a flat bed without using bedrails?: A Little Help needed moving from lying on your back to sitting on the side of a flat bed without using bedrails?: A Little Help needed moving to and from a bed to a chair (including a wheelchair)?: A Little Help needed standing up from a chair using your arms (e.g., wheelchair or bedside chair)?: A Little Help needed to walk in hospital room?: A Little Help needed climbing 3-5 steps with a railing? : A Lot 6 Click Score: 17    End of Session Equipment Utilized During Treatment: Gait belt Activity Tolerance: Patient tolerated treatment well Patient left: in chair;with call bell/phone within reach;with chair alarm set;with family/visitor present Nurse Communication: Mobility status PT Visit Diagnosis: Difficulty in walking, not elsewhere classified (R26.2);Muscle weakness (generalized) (M62.81);Pain Pain - Right/Left: Left Pain - part of body: Hip    Time: 5784-6962 PT Time Calculation (min) (ACUTE ONLY): 19 min   Charges:   PT Evaluation $PT  Eval Low Complexity: 1 Low        Kati PT, DPT Physical Therapist Acute Rehabilitation Services Preferred contact method: Secure Chat Weekend Pager Only: [phone] Office: [phone]   Janan Halter [place] 11/20/2022, 11:47 AM

## 2022-11-20 NOTE — Plan of Care (Signed)
  Problem: Fluid Volume: Goal: Ability to maintain a balanced intake and output will improve Outcome: Progressing   Problem: Nutritional: Goal: Maintenance of adequate nutrition will improve Outcome: Progressing   Problem: Skin Integrity: Goal: Risk for impaired skin integrity will decrease Outcome: Progressing   

## 2022-11-21 DIAGNOSIS — S72002A Fracture of unspecified part of neck of left femur, initial encounter for closed fracture: Secondary | ICD-10-CM | POA: Diagnosis not present

## 2022-11-21 LAB — BASIC METABOLIC PANEL
Anion gap: 5 (ref 5–15)
BUN: 33 mg/dL — ABNORMAL HIGH (ref 8–23)
CO2: 25 mmol/L (ref 22–32)
Calcium: 13.3 mg/dL (ref 8.9–10.3)
Chloride: 112 mmol/L — ABNORMAL HIGH (ref 98–111)
Creatinine, Ser: 0.81 mg/dL (ref 0.44–1.00)
GFR, Estimated: >60 mL/min (ref >60)
Glucose, Bld: 183 mg/dL — ABNORMAL HIGH (ref 70–99)
Potassium: 4 mmol/L (ref 3.5–5.1)
Sodium: 142 mmol/L (ref 135–145)

## 2022-11-21 LAB — CBC
HCT: 38.7 % (ref 36.0–46.0)
Hemoglobin: 12.4 g/dL (ref 12.0–15.0)
MCH: 28.8 pg (ref 26.0–34.0)
MCHC: 32 g/dL (ref 30.0–36.0)
MCV: 89.8 fL (ref 80.0–100.0)
Platelets: 257 10*3/uL (ref 150–400)
RBC: 4.31 MIL/uL (ref 3.87–5.11)
RDW: 13.5 % (ref 11.5–15.5)
WBC: 17.9 10*3/uL — ABNORMAL HIGH (ref 4.0–10.5)
nRBC: 0 % (ref 0.0–0.2)

## 2022-11-21 LAB — GLUCOSE, CAPILLARY
Glucose-Capillary: 154 mg/dL — ABNORMAL HIGH (ref 70–99)
Glucose-Capillary: 149 mg/dL — ABNORMAL HIGH (ref 70–99)
Glucose-Capillary: 186 mg/dL — ABNORMAL HIGH (ref 70–99)
Glucose-Capillary: 272 mg/dL — ABNORMAL HIGH (ref 70–99)

## 2022-11-21 MED ORDER — ZOLEDRONIC ACID 4 MG/100ML IV SOLN
4.0000 mg | Freq: Once | INTRAVENOUS | Status: AC
  Administered 2022-11-21: 4 mg via INTRAVENOUS
  Filled 2022-11-21: qty 100

## 2022-11-21 NOTE — Progress Notes (Signed)
PROGRESS NOTE  Michaela Herrera  QIO:[id number] DOB: 1928/06/01 DOA: 11/17/2022 PCP: [name], MD   Brief Narrative: Patient is an 89 year old female with history of diabetes type 2, hypertension, chronic diastolic CHF who presented with complaint of left hip pain after ground-level mechanical fall at home.  She tripped while attempting to ambulate without  walker at home with immediate feeling of pain on left hip.  Her left hip pain was radiating to the left groin, unable to bear weight on the left lower extremity.  No history of head injury or loss of consciousness.  On presentation she was mildly hypertensive.  WBC count of 14.2.  X-ray of the left hip showed nondisplaced left femoral neck fracture.  Status post left hip hemiarthroplasty on 1/11.  PT/OT evaluation done, recommending SNF.  Hospital course remarkable for persistent leukocytosis, hypercalcemia.  Assessment & Plan:  Principal Problem:   Closed left hip fracture (HCC) Active Problems:   Essential hypertension   Fall   Acute respiratory failure with hypoxia (HCC)   Leukocytosis   DM2 (diabetes mellitus, type 2) (HCC)   Chronic diastolic CHF (congestive heart failure) (HCC)   Protein-calorie malnutrition, severe   Acute left femoral neck fracture: History of fall with immediate development of pain in the left hip, and inability to bear weight.  Orthopedics consulted.s/p ORIF.Marland Kitchen  Started on aspirin for DVT prophylaxis.  PT/OT recommended SNF. Has history of frequent falls, last admitted on 06/29/2022 and was found to have 3 right-sided rib cage fractures after the fall.  Leukocytosis: Unclear etiology, likely reactive.  No clear evidence of underlying infectious process.  Chest x-ray did not show any pneumonia.  Procalcitonin nonreassuring.   UA showed few leukocytes, rare bacteria.  Urine culture showed diphtheroids.  Leukocytosis persist.  Patient remains afebrile.  Started on ceftriaxone,continue for total of 5 days,day 4/5.   CT chest/abdomen/pelvis with contrast  to evaluate for leukocytosis did not show any active infection/abscess which could explain it.  Showed some features of gastritis/duodenitis, started on protonix. Upon reviewing her previous lab works, she has chronic leukocytosis.  Acute hypoxic respiratory failure: Does not use oxygen at home.  Required 2 L of oxygen to maintain saturation of 90 to 92%.  Chest x-ray showed low lung volumes.  Continuing incentive spirometer.  CT imaging of the chest without finding of any pneumonia or pleural effusion.  This is most likely from atelectasis / low lung volume  Type 2 diabetes: Takes glipizide, empagliflozin, metformin.  Patient hemoglobin A1c was 6.6.  Continue sliding's insulin for now.  Continue monitoring blood sugars  Hypertension: Takes metoprolol, amlodipine, clonidine, Imdur at home.resumed.  Monitor blood pressure  Chronic diastolic CHF: Last echo showed EF of 65%, grade 1 diastolic function.  Takes Lasix on Monday, Wednesday, Friday.  Mildly elevated BNP.Lasix restarted  Hypercalcemia: Unclear etiology.  Has chronic hypercalcemia.Calcium level elevated on admission.She was admitted on 06/29/2022 at [facility] and she was found to have elevated calcium level.  She was recommended to follow-up with PCP and do workup including PTH, PTH RP, vitamin D level, TSH.  Donot see any work up in the system.She was treated with calcium nasal spray with improvement in the calcium level, recommended to stop calcium supplementation. Pending vitamin D level.  We ordered nasal calcitonin but she declines, will give a dose of Zometa today.  Calcium level in the range of 13.  Pan CT did not show any evidence of active malignancy  Deconditioning/advanced age/dementia: Patient is confused at baseline  with time as per the daughter.  Oriented to place.  Follows commands.  CODE STATUS DNR.  More alert today.  Obeys commands  Nutrition Problem: Severe Malnutrition Etiology:  chronic illness (CHF)    DVT prophylaxis:SCDs Start: 11/18/22 1950     Code Status: DNR  Family Communication: Discussed with daughter on phone on 1/12  Patient status:Inpatient  Patient is from :Home  Anticipated discharge to:SnF  Estimated DC date: Whenever possible  Consultants: Orthopedics  Procedures: ORIF  Antimicrobials:  Anti-infectives (From admission, onward)    Start     Dose/Rate Route Frequency Ordered Stop   11/18/22 2300  ceFAZolin (ANCEF) IVPB 2g/100 mL premix        2 g 200 mL/hr over 30 Minutes Intravenous Every 6 hours 11/18/22 1949 11/19/22 1049   11/18/22 1445  ceFAZolin (ANCEF) IVPB 2g/100 mL premix        2 g 200 mL/hr over 30 Minutes Intravenous On call to O.R. 11/18/22 1250 11/18/22 1711   11/18/22 1100  cefTRIAXone (ROCEPHIN) 1 g in sodium chloride 0.9 % 100 mL IVPB        1 g 200 mL/hr over 30 Minutes Intravenous Every 24 hours 11/18/22 1008         Subjective: Patient seen and examined at bedside today.  Hemodynamically stable.  Mildly hypertensive.  She  looks better today.  Not in any Distress.  On 2 to 3 L of oxygen.  Not in respite distress.  Confused  Objective: Vitals:   11/20/22 2136 11/21/22 0601 11/21/22 0933 11/21/22 0937  BP: (!) 133/56 (!) 160/70 (!) 169/73   Pulse: 91 86  84  Resp: 16 17    Temp: 97.7 F (36.5 C) (!) 97.5 F (36.4 C)    TempSrc:  Oral    SpO2: 98% 96%    Weight:      Height:        Intake/Output Summary (Last 24 hours) at 11/21/2022 1020 Last data filed at 11/20/2022 2000 Gross per 24 hour  Intake 941.23 ml  Output 900 ml  Net 41.23 ml   Filed Weights   11/18/22 0237 11/18/22 1456 11/20/22 0500  Weight: 54.4 kg 54.4 kg 55 kg    Examination:  General exam: Overall comfortable, not in distress, awake HEENT: PERRL Respiratory system:  no wheezes or crackles  Cardiovascular system: S1 & S2 heard, RRR.  Gastrointestinal system: Abdomen is nondistended, soft and nontender. Central nervous  system: Not alert or oriented Extremities: No edema, no clubbing ,no cyanosis, surgical wound on the left hip Skin: No rashes, no ulcers,no icterus    Data Reviewed: I have personally reviewed following labs and imaging studies  CBC: Recent Labs  Lab 11/17/22 2139 11/18/22 0342 11/19/22 0322 11/20/22 0523 11/21/22 0330  WBC 14.2* 17.4* 17.3* 17.8* 17.9*  NEUTROABS 10.4* 15.4*  --   --   --   HGB 14.3 14.2 12.1 12.7 12.4  HCT 44.5 44.8 38.6 40.7 38.7  MCV 89.4 89.8 91.5 92.1 89.8  PLT 237 230 203 245 948   Basic Metabolic Panel: Recent Labs  Lab 11/17/22 0009 11/18/22 0342 11/19/22 0322 11/20/22 0523 11/21/22 0330  NA 141 143 142 143 142  K 4.4 4.4 4.5 4.2 4.0  CL 111 110 109 112* 112*  CO2 21* 23 21* 19* 25  GLUCOSE 189* 189* 203* 200* 183*  BUN 20 21 25* 28* 33*  CREATININE 1.11* 0.97 0.95 0.91 0.81  CALCIUM 11.6* 12.3* 11.8* 12.3* 13.3*  MG  2.1 1.8  --   --   --   PHOS  --  2.7  --   --   --      Recent Results (from the past 240 hour(s))  Surgical PCR screen     Status: None   Collection Time: 11/18/22  3:56 AM   Specimen: Nasal Mucosa; Nasal Swab  Result Value Ref Range Status   MRSA, PCR NEGATIVE NEGATIVE Final   Staphylococcus aureus NEGATIVE NEGATIVE Final    Comment: (NOTE) The Xpert SA Assay (FDA approved for NASAL specimens in patients 35 years of age and older), is one component of a comprehensive surveillance program. It is not intended to diagnose infection nor to guide or monitor treatment. Performed at [facility], [place] [address], [place], [place] [address]   Urine Culture     Status: Abnormal   Collection Time: 11/18/22  6:54 AM   Specimen: Urine, Clean Catch  Result Value Ref Range Status   Specimen Description   Final    URINE, CLEAN CATCH Performed at [facility], [place] [address] [place], [place] [address]    Special Requests   Final    NONE Performed at [facility], [place] [address] [place], [place] [address]    Culture (A)  Final    50,000 COLONIES/mL DIPHTHEROIDS(CORYNEBACTERIUM SPECIES) Standardized susceptibility testing for this organism is not available. Performed at [facility], [place] [address] [place], [place] [address]    Report Status 11/19/2022 FINAL  Final     Radiology Studies: CT CHEST ABDOMEN PELVIS W CONTRAST  Result Date: 11/20/2022 CLINICAL DATA:  Fall, sepsis. EXAM: CT CHEST, ABDOMEN, AND PELVIS WITH CONTRAST TECHNIQUE: Multidetector CT imaging of the chest, abdomen and pelvis was performed following the standard protocol during bolus administration of intravenous contrast. RADIATION DOSE REDUCTION: This exam was performed according to the departmental dose-optimization program which includes automated exposure control, adjustment of the mA and/or kV according to patient size and/or use of iterative reconstruction technique. CONTRAST:  146mL OMNIPAQUE IOHEXOL 300 MG/ML  SOLN COMPARISON:  Chest CT dated 06/27/2022. FINDINGS: CT CHEST FINDINGS Cardiovascular: No thoracic aortic aneurysm or evidence of aortic injury. No pericardial effusion. Three-vessel coronary artery calcifications. Mediastinum/Nodes: No mass or enlarged lymph nodes appreciated within the mediastinum or perihilar regions. Esophagus is somewhat patulous and fluid-filled. Trachea and central bronchi are unremarkable. Lungs/Pleura: Mild bibasilar atelectasis. Lungs otherwise clear. No pleural effusion or pneumothorax. Musculoskeletal: No evidence of acute osseous fracture or dislocation. Old healed fractures of multiple RIGHT ribs. Degenerative spondylosis of the kyphotic thoracic spine, mild in degree. CT ABDOMEN PELVIS FINDINGS Hepatobiliary: No acute or suspicious findings within the liver. Gallbladder is moderately distended but otherwise unremarkable. No significant bile duct dilatation. Pancreas: Unremarkable. No pancreatic ductal dilatation or surrounding inflammatory  changes. Spleen: Normal in size without focal abnormality. Adrenals/Urinary Tract: Adrenal glands are unremarkable. Kidneys are unremarkable without suspicious mass, stone or hydronephrosis. No ureteral or bladder calculi are identified. Bladder is distended. Small amount of air within the bladder (recent catheterization?). Stomach/Bowel: No significantly dilated large or small bowel loops. Thickening of the walls of the proximal/descending duodenum and overlying gastric antrum. Stomach is moderately distended. Vascular/Lymphatic: Aortic atherosclerosis. No acute-appearing vascular abnormality. No enlarged lymph nodes are seen within the abdomen or pelvis. Reproductive: Uterus and bilateral adnexa are unremarkable. Other: No free fluid or abscess is seen within the abdomen or pelvis. No free intraperitoneal air. Musculoskeletal: Surgical changes of LEFT hip  arthroplasty, presumably recent given the overlying skin staples in the postsurgical air within the overlying soft tissues. No circumscribed fluid or abscess-like collection is seen within the soft tissues about the LEFT hip. IMPRESSION: 1. Surgical changes of LEFT hip arthroplasty, presumably recent given the overlying skin staples. No circumscribed fluid or abscess-like collection is seen within these soft tissues about the LEFT hip. 2. Thickening of the walls of the proximal/descending duodenum and overlying gastric antrum, suspicious for gastritis/duodenitis. 3. Esophagus is somewhat patulous and fluid-filled. This is suspicious for partial obstruction or dysmotility related to the suspected gastritis/duodenitis. 4. Bladder is distended. Small amount of air within the bladder (recent catheterization?). 5. No other acute findings within the chest, abdomen or pelvis. No evidence of pneumonia or pulmonary edema. No evidence of solid organ injury. No free fluid or abscess is seen within the abdomen or pelvis. No free intraperitoneal air. 6. Three-vessel coronary  artery calcifications. Electronically Signed   By: Franki Cabot M.D.   On: 11/20/2022 14:52    Scheduled Meds:  amLODipine  5 mg Oral BID   aspirin EC  325 mg Oral Q breakfast   atorvastatin  10 mg Oral Q M,W,F   calcitonin ([place])  1 spray Alternating Nares Daily   cloNIDine  0.2 mg Oral QHS   docusate sodium  100 mg Oral BID   feeding supplement  237 mL Oral TID BM   furosemide  20 mg Oral Q M,W,F   insulin aspart  0-6 Units Subcutaneous Q6H   isosorbide mononitrate  60 mg Oral Daily   metoprolol tartrate  50 mg Oral Daily   multivitamin with minerals  1 tablet Oral Daily   pantoprazole (PROTONIX) IV  40 mg Intravenous Q12H   polyethylene glycol  17 g Oral Daily   senna  1 tablet Oral BID   zoledronic acid (ZOMETA) IVPB  4 mg Intravenous Once   Continuous Infusions:  sodium chloride 75 mL/hr at 11/20/22 1213   cefTRIAXone (ROCEPHIN)  IV 1 g (11/20/22 1214)   methocarbamol (ROBAXIN) IV       LOS: 4 days   Shelly Coss, MD [facility] P1/14/2024, 10:20 AM

## 2022-11-21 NOTE — Progress Notes (Signed)
   Subjective: 3 Days Post-Op Procedure(s) (LRB): ANTERIOR APPROACH HEMI HIP ARTHROPLASTY (Left)  Pt resting comfortably wakes easily Denies any new symptoms overnight Pain is mild in the left hip currently Patient reports pain as mild.  Objective:   VITALS:   Vitals:   11/20/22 2136 11/21/22 0601  BP: (!) 133/56 (!) 160/70  Pulse: 91 86  Resp: 16 17  Temp: 97.7 F (36.5 C) (!) 97.5 F (36.4 C)  SpO2: 98% 96%    Left hip incision healing well Dressing intact  No drainage or signs of erythema No rashes or edema distally   LABS Recent Labs    11/19/22 0322 11/20/22 0523 11/21/22 0330  HGB 12.1 12.7 12.4  HCT 38.6 40.7 38.7  WBC 17.3* 17.8* 17.9*  PLT 203 245 257    Recent Labs    11/19/22 0322 11/20/22 0523 11/21/22 0330  NA 142 143 142  K 4.5 4.2 4.0  BUN 25* 28* 33*  CREATININE 0.95 0.91 0.81  GLUCOSE 203* 200* 183*     Assessment/Plan: 3 Days Post-Op Procedure(s) (LRB): ANTERIOR APPROACH HEMI HIP ARTHROPLASTY (Left) Continue PT/OT as able D/c planning Will continue to monitor her progress     Merla Riches PA-C, [place] [facility] now [employer] [address] [place], [place], [place] [address] Phone: [phone] [email or web address] Facebook  [employer]

## 2022-11-21 NOTE — Progress Notes (Signed)
   11/21/22 0513  Provider Notification  Provider Name/Title A. Zebedee Iba NP  Date Provider Notified 11/21/22  Time Provider Notified [phone]  Method of Notification Page (secure chat)  Notification Reason Critical Result

## 2022-11-21 NOTE — Progress Notes (Signed)
   11/21/22 0523  Provider Notification  Provider response No new orders  Date of Provider Response 11/21/22  Time of Provider Response [phone]

## 2022-11-21 NOTE — Progress Notes (Signed)
Pt wouldn't allow me to check her CBG. Will try again at 0600

## 2022-11-21 NOTE — Plan of Care (Signed)
  Problem: Coping: Goal: Ability to adjust to condition or change in health will improve Outcome: Progressing   Problem: Fluid Volume: Goal: Ability to maintain a balanced intake and output will improve Outcome: Progressing   Problem: Nutritional: Goal: Maintenance of adequate nutrition will improve Outcome: Progressing   

## 2022-11-22 DIAGNOSIS — S72002A Fracture of unspecified part of neck of left femur, initial encounter for closed fracture: Secondary | ICD-10-CM | POA: Diagnosis not present

## 2022-11-22 LAB — CBC
HCT: 39.2 % (ref 36.0–46.0)
Hemoglobin: 12.6 g/dL (ref 12.0–15.0)
MCH: 28.6 pg (ref 26.0–34.0)
MCHC: 32.1 g/dL (ref 30.0–36.0)
MCV: 89.1 fL (ref 80.0–100.0)
Platelets: 157 10*3/uL (ref 150–400)
RBC: 4.4 MIL/uL (ref 3.87–5.11)
RDW: 13.5 % (ref 11.5–15.5)
WBC: 13.5 10*3/uL — ABNORMAL HIGH (ref 4.0–10.5)
nRBC: 0 % (ref 0.0–0.2)

## 2022-11-22 LAB — GLUCOSE, CAPILLARY
Glucose-Capillary: 153 mg/dL — ABNORMAL HIGH (ref 70–99)
Glucose-Capillary: 121 mg/dL — ABNORMAL HIGH (ref 70–99)
Glucose-Capillary: 255 mg/dL — ABNORMAL HIGH (ref 70–99)
Glucose-Capillary: 187 mg/dL — ABNORMAL HIGH (ref 70–99)

## 2022-11-22 LAB — BASIC METABOLIC PANEL
Anion gap: 6 (ref 5–15)
BUN: 29 mg/dL — ABNORMAL HIGH (ref 8–23)
CO2: 25 mmol/L (ref 22–32)
Calcium: 12 mg/dL — ABNORMAL HIGH (ref 8.9–10.3)
Chloride: 111 mmol/L (ref 98–111)
Creatinine, Ser: 0.82 mg/dL (ref 0.44–1.00)
GFR, Estimated: >60 mL/min (ref >60)
Glucose, Bld: 130 mg/dL — ABNORMAL HIGH (ref 70–99)
Potassium: 4 mmol/L (ref 3.5–5.1)
Sodium: 142 mmol/L (ref 135–145)

## 2022-11-22 LAB — VITAMIN D 25 HYDROXY (VIT D DEFICIENCY, FRACTURES)

## 2022-11-22 MED ORDER — PANTOPRAZOLE SODIUM 40 MG PO TBEC: 40.0000 mg | DELAYED_RELEASE_TABLET | Freq: Two times a day (BID) | ORAL | 0 refills | Status: AC

## 2022-11-22 MED ORDER — SENNA 8.6 MG PO TABS: 1.0000 | ORAL_TABLET | Freq: Every day | ORAL | 0 refills | Status: AC

## 2022-11-22 MED ORDER — PANTOPRAZOLE SODIUM 40 MG PO TBEC
40.0000 mg | DELAYED_RELEASE_TABLET | Freq: Two times a day (BID) | ORAL | Status: DC
  Administered 2022-11-22 – 2022-11-23 (×2): 40 mg via ORAL
  Filled 2022-11-22 (×2): qty 1

## 2022-11-22 MED ORDER — POLYETHYLENE GLYCOL 3350 17 G PO PACK: 17.0000 g | PACK | Freq: Every day | ORAL | 0 refills | Status: AC

## 2022-11-22 NOTE — Discharge Summary (Signed)
Physician Discharge Summary  Michaela SierrasMargie N Champlain EAV:[id number]:[id number] DOB: 11-28-1927 DOA: 11/17/2022  PCP: Orpah CobbKadakia, Ajay, MD  Admit date: 11/17/2022 Discharge date: 11/23/2022  Admitted From: Home Disposition: SNF  Discharge Condition:Stable CODE STATUS: DNR Diet recommendation: Heart Healthy  Brief/Interim Summary:  Patient is an 89 year old female with history of diabetes type 2, hypertension, chronic diastolic CHF who presented with complaint of left hip pain after ground-level mechanical fall at home.  She tripped while attempting to ambulate without  walker at home with immediate feeling of pain on left hip.  Her left hip pain was radiating to the left groin, unable to bear weight on the left lower extremity.  No history of head injury or loss of consciousness.  On presentation she was mildly hypertensive.  WBC count of 14.2.  X-ray of the left hip showed nondisplaced left femoral neck fracture.  Status post left hip hemiarthroplasty on 1/11.  PT/OT evaluation done, recommending SNF.  Hospital course remarkable for persistent leukocytosis, hypercalcemia.,now improving.  Medically stable for discharge whenever possible to SNF. She should follow-up with orthopedics in 2 weeks.  Following problems were addressed during the hospitalization:   Acute left femoral neck fracture: History of fall with immediate development of pain in the left hip, and inability to bear weight.  Orthopedics consulted.s/p ORIF.  Started on aspirin for DVT prophylaxis.  PT/OT recommended SNF. Has history of frequent falls, last admitted on 06/29/2022 and was found to have 3 right-sided rib cage fractures after the fall.   Leukocytosis: Unclear etiology, likely reactive.  No clear evidence of underlying infectious process.  Chest x-ray did not show any pneumonia.  Procalcitonin nonreassuring.   UA showed few leukocytes, rare bacteria.  Urine culture showed diphtheroids.   Patient remains afebrile.  Started on  ceftriaxone,completed 5 days course.  CT chest/abdomen/pelvis with contrast  to evaluate for leukocytosis did not show any active infection/abscess which could explain it.  Showed some features of gastritis/duodenitis, started on protonix. Upon reviewing her previous lab works, she has chronic leukocytosis.  Continue to monitor as an outpatient   Acute hypoxic respiratory failure: Does not use oxygen at home.  Required 2 L of oxygen to maintain saturation of 90 to 92%.  Chest x-ray showed low lung volumes.  Continuing incentive spirometer.  CT imaging of the chest without finding of any pneumonia or pleural effusion.  This is most likely from atelectasis / low lung volume   Type 2 diabetes: Takes glipizide, empagliflozin, metformin.  Patient hemoglobin A1c was 6.6.   Continue monitoring blood sugars   Hypertension: Takes metoprolol, amlodipine, clonidine, Imdur at home.resumed.  Monitor blood pressure   Chronic diastolic CHF: Last echo showed EF of 65%, grade 1 diastolic function.  Takes Lasix on Monday, Wednesday, Friday.  Mildly elevated BNP.Lasix restarted   Hypercalcemia: Unclear etiology.  Has chronic hypercalcemia.Calcium level elevated on admission.She was [facility] and she was found to have elevated calcium level.  She was recommended to follow-up with PCP and do workup including PTH, PTH RP, vitamin D level, TSH.  Donot see any work up in the system.She was treated with calcium nasal spray with improvement in the calcium level, recommended to stop calcium supplementation. Pending vitamin D level.  We ordered nasal calcitonin but she declined, given a dose of Zometa , calcium level trending down.  Check BMP in a week pan CT did not show any evidence of active malignancy.   Deconditioning/advanced age/dementia: Patient is confused at baseline with time  as per the daughter.  Oriented to place.  Follows commands.  CODE STATUS DNR.     Discharge Diagnoses:  Principal  Problem:   Closed left hip fracture ([place]) Active Problems:   Essential hypertension   Fall   Acute respiratory failure with hypoxia (HCC)   Leukocytosis   DM2 (diabetes mellitus, type 2) (HCC)   Chronic diastolic CHF (congestive heart failure) (HCC)   Protein-calorie malnutrition, severe    Discharge Instructions  Discharge Instructions     Diet - low sodium heart healthy   Complete by: As directed    Discharge instructions   Complete by: As directed    1)Please take prescribed medications as instructed 2)Follow up with orthopedics in 2 weeks.  Name and number the provider group has been attached 3)Do a CBC and BMP test in a week.   Increase activity slowly   Complete by: As directed       Allergies as of 11/23/2022   No Known Allergies      Medication List     STOP taking these medications    aspirin EC 81 MG tablet Replaced by: aspirin 81 MG chewable tablet   potassium chloride 10 MEQ tablet Commonly known as: KLOR-CON M       TAKE these medications    acetaminophen 500 MG tablet Commonly known as: TYLENOL Take 1 tablet (500 mg total) by mouth every 8 (eight) hours as needed for moderate pain or mild pain.   amLODipine 5 MG tablet Commonly known as: NORVASC Take 5 mg by mouth 2 (two) times daily.   aspirin 81 MG chewable tablet Commonly known as: Aspirin Childrens Chew 1 tablet (81 mg total) by mouth 2 (two) times daily with a meal. Replaces: aspirin EC 81 MG tablet   atorvastatin 10 MG tablet Commonly known as: LIPITOR Take 10 mg by mouth See admin instructions. Take 10 mg by mouth at bedtime on Mon/Wed/Fri only   cloNIDine 0.2 MG tablet Commonly known as: CATAPRES Take 1 tablet (0.2 mg total) by mouth 3 (three) times daily. What changed: when to take this   furosemide 20 MG tablet Commonly known as: LASIX Take 1 tablet (20 mg total) by mouth every Monday, Wednesday, and Friday.   glipiZIDE 5 MG 24 hr tablet Commonly known as: GLUCOTROL  XL Take 1 tablet (5 mg total) by mouth daily with breakfast. What changed: how much to take   HYDROcodone-acetaminophen 10-325 MG tablet Commonly known as: Norco Take 0.5 tablets by mouth every 4 (four) hours as needed for up to 7 days for moderate pain or severe pain.   isosorbide mononitrate 60 MG 24 hr tablet Commonly known as: IMDUR Take 60 mg by mouth daily.   Januvia 100 MG tablet Generic drug: sitaGLIPtin Take 50 mg by mouth daily.   Jardiance 10 MG Tabs tablet Generic drug: empagliflozin Take 10 mg by mouth daily.   metFORMIN 500 MG tablet Commonly known as: GLUCOPHAGE Take 500 mg by mouth 2 (two) times daily with a meal.   metoprolol tartrate 50 MG tablet Commonly known as: LOPRESSOR Take 50 mg by mouth in the morning.   pantoprazole 40 MG tablet Commonly known as: PROTONIX Take 1 tablet (40 mg total) by mouth 2 (two) times daily.   polyethylene glycol 17 g packet Commonly known as: MIRALAX / GLYCOLAX Take 17 g by mouth daily.   potassium chloride 10 MEQ tablet Commonly known as: KLOR-CON Take 10 mEq by mouth daily.   QUEtiapine 25 MG  tablet Commonly known as: SEROQUEL Take 0.5 tablets (12.5 mg total) by mouth at bedtime. What changed: how much to take   senna 8.6 MG Tabs tablet Commonly known as: SENOKOT Take 1 tablet (8.6 mg total) by mouth daily.   VITAMIN B 12 PO Take 1 tablet by mouth daily with breakfast.        Contact information for follow-up providers     Clois Dupes, PA-C Follow up in 2 week(s).   Specialty: Orthopedic Surgery Why: For suture removal, For wound re-check Contact information: [address] [place] [address] [phone]              Contact information for after-discharge care     Destination     [facility], [facility] Preferred SNF .   Service: Skilled Nursing Contact information: [address] [place] [address] [phone]                     No Known Allergies  Consultations: Orthopedics   Procedures/Studies: CT CHEST ABDOMEN PELVIS W CONTRAST  Result Date: 11/20/2022 CLINICAL DATA:  Fall, sepsis. EXAM: CT CHEST, ABDOMEN, AND PELVIS WITH CONTRAST TECHNIQUE: Multidetector CT imaging of the chest, abdomen and pelvis was performed following the standard protocol during bolus administration of intravenous contrast. RADIATION DOSE REDUCTION: This exam was performed according to the departmental dose-optimization program which includes automated exposure control, adjustment of the mA and/or kV according to patient size and/or use of iterative reconstruction technique. CONTRAST:  OMNIPAQUE IOHEXOL 300 MG/ML  SOLN COMPARISON:  Chest CT dated 06/27/2022. FINDINGS: CT CHEST FINDINGS Cardiovascular: No thoracic aortic aneurysm or evidence of aortic injury. No pericardial effusion. Three-vessel coronary artery calcifications. Mediastinum/Nodes: No mass or enlarged lymph nodes appreciated within the mediastinum or perihilar regions. Esophagus is somewhat patulous and fluid-filled. Trachea and central bronchi are unremarkable. Lungs/Pleura: Mild bibasilar atelectasis. Lungs otherwise clear. No pleural effusion or pneumothorax. Musculoskeletal: No evidence of acute osseous fracture or dislocation. Old healed fractures of multiple RIGHT ribs. Degenerative spondylosis of the kyphotic thoracic spine, mild in degree. CT ABDOMEN PELVIS FINDINGS Hepatobiliary: No acute or suspicious findings within the liver. Gallbladder is moderately distended but otherwise unremarkable. No significant bile duct dilatation. Pancreas: Unremarkable. No pancreatic ductal dilatation or surrounding inflammatory changes. Spleen: Normal in size without focal abnormality. Adrenals/Urinary Tract: Adrenal glands are unremarkable. Kidneys are unremarkable without suspicious mass, stone or hydronephrosis. No ureteral or bladder calculi are identified. Bladder is distended.  Small amount of air within the bladder (recent catheterization?). Stomach/Bowel: No significantly dilated large or small bowel loops. Thickening of the walls of the proximal/descending duodenum and overlying gastric antrum. Stomach is moderately distended. Vascular/Lymphatic: Aortic atherosclerosis. No acute-appearing vascular abnormality. No enlarged lymph nodes are seen within the abdomen or pelvis. Reproductive: Uterus and bilateral adnexa are unremarkable. Other: No free fluid or abscess is seen within the abdomen or pelvis. No free intraperitoneal air. Musculoskeletal: Surgical changes of LEFT hip arthroplasty, presumably recent given the overlying skin staples in the postsurgical air within the overlying soft tissues. No circumscribed fluid or abscess-like collection is seen within the soft tissues about the LEFT hip. IMPRESSION: 1. Surgical changes of LEFT hip arthroplasty, presumably recent given the overlying skin staples. No circumscribed fluid or abscess-like collection is seen within these soft tissues about the LEFT hip. 2. Thickening of the walls of the proximal/descending duodenum and overlying gastric antrum, suspicious for gastritis/duodenitis. 3. Esophagus is somewhat patulous and fluid-filled. This is  suspicious for partial obstruction or dysmotility related to the suspected gastritis/duodenitis. 4. Bladder is distended. Small amount of air within the bladder (recent catheterization?). 5. No other acute findings within the chest, abdomen or pelvis. No evidence of pneumonia or pulmonary edema. No evidence of solid organ injury. No free fluid or abscess is seen within the abdomen or pelvis. No free intraperitoneal air. 6. Three-vessel coronary artery calcifications. Electronically Signed   By: Franki Cabot M.D.   On: 11/20/2022 14:52   Pelvis Portable  Result Date: 11/18/2022 CLINICAL DATA:  [id number] Closed displaced fracture of left femoral neck ([place]) [id number] EXAM: PORTABLE PELVIS 1-2 VIEWS  COMPARISON:  11/17/2022 FINDINGS: Interval left hip arthroplasty, components project in expected location. No fracture or dislocation. Lateral skin staples and subcutaneous emphysema. Patient is osteopenic. Bony pelvis appears grossly intact. Spondylitic changes in the visualized lower lumbar spine. Iliofemoral arterial calcifications. IMPRESSION: Left hip arthroplasty, without apparent complication. Electronically Signed   By: Lucrezia Europe M.D.   On: 11/18/2022 19:05   DG HIP UNILAT WITH PELVIS 1V LEFT  Result Date: 11/18/2022 CLINICAL DATA:  Surgery. EXAM: DG HIP (WITH OR WITHOUT PELVIS) 1V*L* COMPARISON:  Left hip x-ray 11/17/2022 FINDINGS: Intraoperative left hip. Four low resolution intraoperative spot views of the left hip were obtained. New left hip arthroplasty present in anatomic alignment. No fracture visible on the limited views. Total fluoroscopy time: 6 seconds Total radiation dose: 0.65 micro Gy IMPRESSION: Intraoperative left hip arthroplasty in anatomic alignment. Electronically Signed   By: Ronney Asters M.D.   On: 11/18/2022 18:26   DG C-Arm 1-60 Min-No Report  Result Date: 11/18/2022 Fluoroscopy was utilized by the requesting physician.  No radiographic interpretation.   DG Knee Left Port  Result Date: 11/18/2022 CLINICAL DATA:  Closed displaced fracture left femoral neck. EXAM: PORTABLE LEFT KNEE - 1-2 VIEW COMPARISON:  None Available. FINDINGS: There is diffuse decreased bone mineralization. Severe lateral and moderate medial and patellofemoral compartment joint space narrowing with tricompartmental peripheral degenerative osteophytes. Tiny joint effusion. Within the limitations of diffuse decreased bone mineralization, there is minimal cortical irregularity and possible minimal 1 mm cortical step-off at the lateral aspect of the proximal metaphysis of the fibula, compatible with an acute nondisplaced fracture. However, no distinct fracture line is visible. Additional overlying  lucencies and densities from the patient's overlying clothing somewhat limit evaluation of fine bony detail. Moderate vascular calcifications. IMPRESSION: 1. Minimal cortical step-off and minimal cortical irregularity of the lateral proximal aspect of the fibula ends indeterminate but could represent an acute nondisplaced fracture. Recommend clinical correlation for point tenderness. No distinct fracture line is visualized. 2. 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 Electronically Signed   By: Yvonne Kendall M.D.   On: 11/18/2022 08:25   CT HIP LEFT WO CONTRAST  Result Date: 11/18/2022 CLINICAL DATA:  CT of the left hip to further characterize fracture. Possible surgery fall EXAM: CT OF THE LEFT HIP WITHOUT CONTRAST TECHNIQUE: Multidetector CT imaging of the left hip was performed according to the standard protocol. Multiplanar CT image reconstructions were also generated. RADIATION DOSE REDUCTION: This exam was performed according to the departmental dose-optimization program which includes automated exposure control, adjustment of the mA and/or kV according to patient size and/or use of iterative reconstruction technique. COMPARISON:  Radiographs earlier today FINDINGS: Bones/Joint/Cartilage Redemonstrated acute comminuted subcapital left femoral neck fracture the distal fragment is rotated posteriorly with approximately 8 mm of widening about the apex of the angulation there is slight impaction  of the posterior femoral neck into the femoral head. Slight lateral displacement of the distal dominant fragment. No additional fractures identified. Ligaments Suboptimally assessed by CT. Muscles and Tendons Calcifications at the origin of the left hamstring tendons. Soft tissues Mild edema about the left hip. IMPRESSION: Redemonstrated left femoral neck fracture. Electronically Signed   By: Minerva Fester M.D.   On: 11/18/2022 00:25   DG Hip Unilat W or Wo Pelvis 2-3 Views  Left  Result Date: 11/17/2022 CLINICAL DATA:  Fall EXAM: DG HIP (WITH OR WITHOUT PELVIS) 2-3V LEFT COMPARISON:  None Available. FINDINGS: There is a nondisplaced left femoral neck fracture. There is no dislocation. Are mild degenerative changes of the left hip. Vascular calcifications are seen in the soft tissues. IMPRESSION: Nondisplaced left femoral neck fracture. Electronically Signed   By: Darliss Cheney M.D.   On: 11/17/2022 22:19   DG Chest 1 View  Result Date: 11/17/2022 CLINICAL DATA:  Status post fall. EXAM: CHEST  1 VIEW COMPARISON:  June 27, 2022 FINDINGS: The heart size and mediastinal contours are within normal limits. There is moderate severity calcification of the aortic arch. Low lung volumes are noted. There is no evidence of an acute infiltrate, pleural effusion or pneumothorax. The visualized skeletal structures are unremarkable. IMPRESSION: Low lung volumes without evidence of acute cardiopulmonary disease. Electronically Signed   By: Aram Candela M.D.   On: 11/17/2022 22:18      Subjective: Patient seen and examined at bedside today.  Hemodynamically stable.  Lying on the bed.  Appears weak, deconditioned.  Alert and awake and oriented to place.  Discharge Exam: Vitals:   11/22/22 2117 11/23/22 0353  BP: (!) 141/63 (!) 149/67  Pulse: 92 90  Resp: 17 20  Temp: 99.6 F (37.6 C) 98.7 F (37.1 C)  SpO2: 99% 99%   Vitals:   11/22/22 0511 11/22/22 1442 11/22/22 2117 11/23/22 0353  BP: (!) 141/56 (!) 141/56 (!) 141/63 (!) 149/67  Pulse: 72 97 92 90  Resp: 16 16 17 20   Temp: 97.9 F (36.6 C) 98.5 F (36.9 C) 99.6 F (37.6 C) 98.7 F (37.1 C)  TempSrc: Oral Oral Oral Oral  SpO2: 99% 92% 99% 99%  Weight:      Height:        General: Pt is alert, awake, not in acute distress, very deconditioned, weak, pleasant elderly female Cardiovascular: RRR, S1/S2 +, no rubs, no gallops Respiratory: CTA bilaterally, no wheezing, no rhonchi Abdominal: Soft, NT, ND, bowel  sounds + Extremities: no edema, no cyanosis, surgical wound on the left hip    The results of significant diagnostics from this hospitalization (including imaging, microbiology, ancillary and laboratory) are listed below for reference.     Microbiology: Recent Results (from the past 240 hour(s))  Surgical PCR screen     Status: None   Collection Time: 11/18/22  3:56 AM   Specimen: Nasal Mucosa; Nasal Swab  Result Value Ref Range Status   MRSA, PCR NEGATIVE NEGATIVE Final   Staphylococcus aureus NEGATIVE NEGATIVE Final    Comment: (NOTE) The Xpert SA Assay (FDA approved for NASAL specimens in patients 56 years of age and older), is one component of a comprehensive surveillance program. It is not intended to diagnose infection nor to guide or monitor treatment. Performed at [facility], [address] [place], [place]   Urine Culture     Status: Abnormal   Collection Time: 11/18/22  6:54 AM   Specimen: Urine, Clean  Catch  Result Value Ref Range Status   Specimen Description   Final    URINE, CLEAN CATCH Performed at [facility], [address], [place], [place] [address]    Special Requests   Final    NONE Performed at [facility], [address], [place], [place] [address]    Culture (A)  Final    50,000 COLONIES/mL DIPHTHEROIDS(CORYNEBACTERIUM SPECIES) Standardized susceptibility testing for this organism is not available. Performed at [facility], [address] [place], [place] [address]    Report Status 11/19/2022 FINAL  Final     Labs: BNP (last 3 results) Recent Labs    06/29/22 0209 11/18/22 0342  BNP 112.1* 117.9*   Basic Metabolic Panel: Recent Labs  Lab 11/17/22 0009 11/18/22 0342 11/19/22 0322 11/20/22 0523 11/21/22 0330 11/22/22 0350  NA 141 143 142 143 142 142  K 4.4 4.4 4.5 4.2 4.0 4.0  CL 111 110 109 112* 112* 111  CO2 21* 23 21* 19* 25 25  GLUCOSE 189* 189*  203* 200* 183* 130*  BUN 20 21 25* 28* 33* 29*  CREATININE 1.11* 0.97 0.95 0.91 0.81 0.82  CALCIUM 11.6* 12.3* 11.8* 12.3* 13.3* 12.0*  MG 2.1 1.8  --   --   --   --   PHOS  --  2.7  --   --   --   --    Liver Function Tests: Recent Labs  Lab 11/18/22 0342  AST 20  ALT 18  ALKPHOS 131*  BILITOT 0.9  PROT 7.1  ALBUMIN 3.9   No results for input(s): "LIPASE", "AMYLASE" in the last 168 hours. No results for input(s): "AMMONIA" in the last 168 hours. CBC: Recent Labs  Lab 11/17/22 2139 11/18/22 0342 11/19/22 0322 11/20/22 0523 11/21/22 0330 11/22/22 0350  WBC 14.2* 17.4* 17.3* 17.8* 17.9* 13.5*  NEUTROABS 10.4* 15.4*  --   --   --   --   HGB 14.3 14.2 12.1 12.7 12.4 12.6  HCT 44.5 44.8 38.6 40.7 38.7 39.2  MCV 89.4 89.8 91.5 92.1 89.8 89.1  PLT 237 230 203 245 257 157   Cardiac Enzymes: No results for input(s): "CKTOTAL", "CKMB", "CKMBINDEX", "TROPONINI" in the last 168 hours. BNP: Invalid input(s): "POCBNP" CBG: Recent Labs  Lab 11/22/22 0624 11/22/22 1237 11/22/22 1810 11/23/22 0011 11/23/22 0721  GLUCAP 121* 255* 187* 137* 169*   D-Dimer No results for input(s): "DDIMER" in the last 72 hours. Hgb A1c No results for input(s): "HGBA1C" in the last 72 hours. Lipid Profile No results for input(s): "CHOL", "HDL", "LDLCALC", "TRIG", "CHOLHDL", "LDLDIRECT" in the last 72 hours. Thyroid function studies No results for input(s): "TSH", "T4TOTAL", "T3FREE", "THYROIDAB" in the last 72 hours.  Invalid input(s): "FREET3" Anemia work up No results for input(s): "VITAMINB12", "FOLATE", "FERRITIN", "TIBC", "IRON", "RETICCTPCT" in the last 72 hours. Urinalysis    Component Value Date/Time   COLORURINE YELLOW 11/18/2022 0654   APPEARANCEUR CLEAR 11/18/2022 0654   LABSPEC 1.022 11/18/2022 0654   PHURINE 5.0 11/18/2022 0654   GLUCOSEU >=500 (A) 11/18/2022 0654   HGBUR NEGATIVE 11/18/2022 0654   BILIRUBINUR NEGATIVE 11/18/2022 0654   KETONESUR 20 (A) 11/18/2022 0654    PROTEINUR NEGATIVE 11/18/2022 0654   NITRITE NEGATIVE 11/18/2022 0654   LEUKOCYTESUR TRACE (A) 11/18/2022 0654   Sepsis Labs Recent Labs  Lab 11/19/22 0322 11/20/22 0523 11/21/22 0330 11/22/22 0350  WBC 17.3* 17.8* 17.9* 13.5*   Microbiology Recent Results (from the past  240 hour(s))  Surgical PCR screen     Status: None   Collection Time: 11/18/22  3:56 AM   Specimen: Nasal Mucosa; Nasal Swab  Result Value Ref Range Status   MRSA, PCR NEGATIVE NEGATIVE Final   Staphylococcus aureus NEGATIVE NEGATIVE Final    Comment: (NOTE) The Xpert SA Assay (FDA approved for NASAL specimens in patients 87 years of age and older), is one component of a comprehensive surveillance program. It is not intended to diagnose infection nor to guide or monitor treatment. Performed at [facility], [address] [place], [place] [address]   Urine Culture     Status: Abnormal   Collection Time: 11/18/22  6:54 AM   Specimen: Urine, Clean Catch  Result Value Ref Range Status   Specimen Description   Final    URINE, CLEAN CATCH Performed at [facility], [address] [place], [place] [address]    Special Requests   Final    NONE Performed at [facility], [address] [place], [place] [address]    Culture (A)  Final    50,000 COLONIES/mL DIPHTHEROIDS(CORYNEBACTERIUM SPECIES) Standardized susceptibility testing for this organism is not available. Performed at [facility], [address] [place], [place] [address]    Report Status 11/19/2022 FINAL  Final    Please note: You were cared for by a hospitalist during your hospital stay. Once you are discharged, your primary care physician will handle any further medical issues. Please note that NO REFILLS for any discharge medications will be authorized once you are discharged, as it is imperative that you return to your primary care physician (or establish a relationship with  a primary care physician if you do not have one) for your post hospital discharge needs so that they can reassess your need for medications and monitor your lab values.    Time coordinating discharge: 40 minutes  SIGNED:   Burnadette PopAmrit Carlyn Mullenbach, MD  [facility] 11/23/2022, 9:49 AM Pager [phone]  If 7PM-7AM, please contact night-coverage [email or web address] Password [email or web address]

## 2022-11-22 NOTE — TOC Progression Note (Signed)
Transition of Care [facility]) - Progression Note   Patient Details  Name: SHARMA LAWRANCE MRN: [id number] Date of Birth: [birth year]  Transition of Care [facility]) CM/SW [place], LCSW Phone Number: 11/22/2022, 2:10 PM  Clinical Narrative: Bed offers provided to patient and daughter. Family chose [facility]. CSW confirmed bed acceptance with Lorenza Chick in admissions. The bed will be available tomorrow. CSW completed insurance authorization on [employer] portal. Reference ID # is: [id number]. Patient is approved for 11/23/2022-11/25/2022. CSW updated facility and hospitalist.    Barriers to Discharge: Other (must enter comment) (SNF bed available at [facility] on 11/23/22.)  Expected Discharge Plan and Services Post Acute Care Choice: [place] Expected Discharge Date: 11/22/22               DME Arranged: N/A DME Agency: NA  Social Determinants of Health (SDOH) Interventions SDOH Screenings   Food Insecurity: No Food Insecurity (11/18/2022)  Housing: Low Risk  (11/18/2022)  Transportation Needs: No Transportation Needs (11/18/2022)  Utilities: Not At Risk (11/18/2022)  Tobacco Use: Low Risk  (11/19/2022)   Readmission Risk Interventions     No data to display

## 2022-11-22 NOTE — Plan of Care (Signed)

## 2022-11-23 LAB — GLUCOSE, CAPILLARY
Glucose-Capillary: 137 mg/dL — ABNORMAL HIGH (ref 70–99)
Glucose-Capillary: 169 mg/dL — ABNORMAL HIGH (ref 70–99)
Glucose-Capillary: 291 mg/dL — ABNORMAL HIGH (ref 70–99)

## 2022-11-23 MED ORDER — QUETIAPINE FUMARATE 25 MG PO TABS: 12.5000 mg | ORAL_TABLET | Freq: Every day | ORAL | Status: AC

## 2022-11-23 NOTE — TOC Transition Note (Signed)
Transition of Care [facility]) - CM/SW Discharge Note  Patient Details  Name: Michaela Herrera MRN: [id number] Date of Birth: 29-Dec-1927  Transition of Care [facility]) CM/SW Contact:  Sherie Don, LCSW Phone Number: 11/23/2022, 11:42 AM  Clinical Narrative: Patient will discharge to [place] today. Patient will go to room 106P and the number for report is [phone]. Discharge summary, discharge orders, and SNF transfer report faxed to facility in hub. Medical necessity form done; PTAR scheduled. Patient and daughter notified of discharge. Discharge packet completed. RN updated. TOC signing off.  Final next level of care: Skilled Nursing Facility Barriers to Discharge: Barriers Resolved  Patient Goals and CMS Choice CMS Medicare.gov Compare Post Acute Care list provided to:: Patient Represenative (must comment) Choice offered to / list presented to : Adult Children, Patient  Discharge Placement PASRR number recieved: 11/20/22     Patient [facility] bed at: [facility] Patient to be transferred to facility by: [place] Name of family member notified: Prerna Harold (daughter) Patient and family notified of of transfer: 11/23/22  Discharge Plan and Services Additional resources added to the After Visit Summary for   Post Acute Care Choice: [place]          DME Arranged: N/A DME Agency: NA  Social Determinants of Health ([place]) Interventions SDOH Screenings   Food Insecurity: No Food Insecurity (11/18/2022)  Housing: Low Risk  (11/18/2022)  Transportation Needs: No Transportation Needs (11/18/2022)  Utilities: Not At Risk (11/18/2022)  Tobacco Use: Low Risk  (11/19/2022)   Readmission Risk Interventions     No data to display

## 2022-11-23 NOTE — Progress Notes (Signed)
Patient seen and examined at bedside today.  Hemodynamically stable.  She was sleeping when I arrived, woke up on calling her name.  Appears comfortable.  Not oriented to time which is her baseline.  Medically stable for discharge to skilled nursing facility today.  DC summary and orders already there.  No change in the medical management. I called and updated her daughter about discharge plan

## 2022-11-23 NOTE — Care Management Important Message (Signed)
Important Message  Patient Details IM Letter given. Name: JENIAH KISHI MRN: [id number] Date of Birth: September 04, 1928   Medicare Important Message Given:  Yes     Kerin Salen 11/23/2022, 10:00 AM

## 2022-11-29 ENCOUNTER — Emergency Department (HOSPITAL_COMMUNITY): Payer: Medicare HMO

## 2022-11-29 ENCOUNTER — Ambulatory Visit: Payer: Self-pay | Admitting: Student

## 2022-11-29 ENCOUNTER — Emergency Department (HOSPITAL_COMMUNITY)
Admission: EM | Admit: 2022-11-29 | Discharge: 2022-11-29 | Disposition: A | Discharge status: Skilled Nursing Facility | Payer: Medicare HMO | Attending: Emergency Medicine | Admitting: Emergency Medicine

## 2022-11-29 ENCOUNTER — Other Ambulatory Visit: Payer: Self-pay

## 2022-11-29 DIAGNOSIS — Z7984 Long term (current) use of oral hypoglycemic drugs: Secondary | ICD-10-CM | POA: Insufficient documentation

## 2022-11-29 DIAGNOSIS — Z79899 Other long term (current) drug therapy: Secondary | ICD-10-CM | POA: Diagnosis not present

## 2022-11-29 DIAGNOSIS — E119 Type 2 diabetes mellitus without complications: Secondary | ICD-10-CM | POA: Diagnosis not present

## 2022-11-29 DIAGNOSIS — R55 Syncope and collapse: Secondary | ICD-10-CM | POA: Insufficient documentation

## 2022-11-29 DIAGNOSIS — Z7982 Long term (current) use of aspirin: Secondary | ICD-10-CM | POA: Diagnosis not present

## 2022-11-29 DIAGNOSIS — R000 Tachycardia, unspecified: Secondary | ICD-10-CM | POA: Diagnosis not present

## 2022-11-29 DIAGNOSIS — I10 Essential (primary) hypertension: Secondary | ICD-10-CM | POA: Insufficient documentation

## 2022-11-29 LAB — CBC
HCT: 33.1 % — ABNORMAL LOW (ref 36.0–46.0)
Hemoglobin: 10.5 g/dL — ABNORMAL LOW (ref 12.0–15.0)
MCH: 28.8 pg (ref 26.0–34.0)
MCHC: 31.7 g/dL (ref 30.0–36.0)
MCV: 90.7 fL (ref 80.0–100.0)
Platelets: 291 10*3/uL (ref 150–400)
RBC: 3.65 MIL/uL — ABNORMAL LOW (ref 3.87–5.11)
RDW: 13.9 % (ref 11.5–15.5)
WBC: 12.9 10*3/uL — ABNORMAL HIGH (ref 4.0–10.5)
nRBC: 0 % (ref 0.0–0.2)

## 2022-11-29 LAB — COMPREHENSIVE METABOLIC PANEL
ALT: 14 U/L (ref 0–44)
AST: 21 U/L (ref 15–41)
Albumin: 2.9 g/dL — ABNORMAL LOW (ref 3.5–5.0)
Alkaline Phosphatase: 91 U/L (ref 38–126)
Anion gap: 4 — ABNORMAL LOW (ref 5–15)
BUN: 17 mg/dL (ref 8–23)
CO2: 24 mmol/L (ref 22–32)
Calcium: 9.9 mg/dL (ref 8.9–10.3)
Chloride: 110 mmol/L (ref 98–111)
Creatinine, Ser: 1.09 mg/dL — ABNORMAL HIGH (ref 0.44–1.00)
GFR, Estimated: 48 mL/min — ABNORMAL LOW (ref >60)
Glucose, Bld: 135 mg/dL — ABNORMAL HIGH (ref 70–99)
Potassium: 4.2 mmol/L (ref 3.5–5.1)
Sodium: 138 mmol/L (ref 135–145)
Total Bilirubin: 0.5 mg/dL (ref 0.3–1.2)
Total Protein: 5.8 g/dL — ABNORMAL LOW (ref 6.5–8.1)

## 2022-11-29 LAB — CBG MONITORING, ED: Glucose-Capillary: 103 mg/dL — ABNORMAL HIGH (ref 70–99)

## 2022-11-29 LAB — POC OCCULT BLOOD, ED: Fecal Occult Bld: NEGATIVE

## 2022-11-29 MED ORDER — SODIUM CHLORIDE 0.9 % IV BOLUS
500.0000 mL | Freq: Once | INTRAVENOUS | Status: AC
  Administered 2022-11-29: 500 mL via INTRAVENOUS

## 2022-11-29 NOTE — ED Provider Notes (Signed)
[place] EMERGENCY DEPARTMENT AT [facility] Provider Note   CSN: [id number] Arrival date & time: 11/29/22  1441     History  Chief Complaint  Patient presents with   Altered Mental Status    Michaela Herrera is a 89 y.o. female.   Altered Mental Status Patient brought in for mental status change.  Reportedly found unresponsive with GCS of 3.  Reportedly unresponsive but still breathing.  With EMS woke up and reportedly back to baseline.  Patient is awake and appropriate.  States she just went out does not really know what happened.  Feeling fine now.  No headache.  She is hoping she will be able to go back home today.    Past Medical History:  Diagnosis Date   Diabetes mellitus without complication ([place])    Diabetic retinopathy ([place])    NPDR OU   GERD (gastroesophageal reflux disease)    Hypertension    Hypertensive retinopathy    OU    Home Medications Prior to Admission medications   Medication Sig Start Date End Date Taking? Authorizing Provider  acetaminophen (TYLENOL) 500 MG tablet Take 1 tablet (500 mg total) by mouth every 8 (eight) hours as needed for moderate pain or mild pain. 06/29/22 06/29/23  Thurnell Lose, MD  amLODipine (NORVASC) 5 MG tablet Take 5 mg by mouth 2 (two) times daily. 12/11/15   [provider]  aspirin (ASPIRIN CHILDRENS) 81 MG chewable tablet Chew 1 tablet (81 mg total) by mouth 2 (two) times daily with a meal. 11/19/22 01/03/23  Hill, Marciano Sequin, PA-C  atorvastatin (LIPITOR) 10 MG tablet Take 10 mg by mouth See admin instructions. Take 10 mg by mouth at bedtime on Mon/Wed/Fri only 07/07/20   [provider]  cloNIDine (CATAPRES) 0.2 MG tablet Take 1 tablet (0.2 mg total) by mouth 3 (three) times daily. Patient taking differently: Take 0.2 mg by mouth at bedtime. 06/29/22   Thurnell Lose, MD  Cyanocobalamin (VITAMIN B 12 PO) Take 1 tablet by mouth daily with breakfast.    [provider]  furosemide (LASIX)  20 MG tablet Take 1 tablet (20 mg total) by mouth every Monday, Wednesday, and Friday. 07/02/22   Thurnell Lose, MD  glipiZIDE (GLUCOTROL XL) 5 MG 24 hr tablet Take 1 tablet (5 mg total) by mouth daily with breakfast. Patient taking differently: Take 2.5 mg by mouth daily with breakfast. 01/23/16   Dixie Dials, MD  isosorbide mononitrate (IMDUR) 60 MG 24 hr tablet Take 60 mg by mouth daily. 12/04/15   [provider]  JANUVIA 100 MG tablet Take 50 mg by mouth daily. 10/07/20   [provider]  JARDIANCE 10 MG TABS tablet Take 10 mg by mouth daily. 06/02/22   [provider]  metFORMIN (GLUCOPHAGE) 500 MG tablet Take 500 mg by mouth 2 (two) times daily with a meal.    [provider]  metoprolol (LOPRESSOR) 50 MG tablet Take 50 mg by mouth in the morning. 12/04/15   [provider]  pantoprazole (PROTONIX) 40 MG tablet Take 1 tablet (40 mg total) by mouth 2 (two) times daily. 11/22/22 12/22/22  Shelly Coss, MD  polyethylene glycol (MIRALAX / GLYCOLAX) 17 g packet Take 17 g by mouth daily. 11/23/22   Shelly Coss, MD  potassium chloride (KLOR-CON) 10 MEQ tablet Take 10 mEq by mouth daily. 09/21/22   [provider]  QUEtiapine (SEROQUEL) 25 MG tablet Take 0.5 tablets (12.5 mg total) by mouth  at bedtime. 11/23/22   Burnadette Pop, MD  senna (SENOKOT) 8.6 MG TABS tablet Take 1 tablet (8.6 mg total) by mouth daily. 11/22/22   Burnadette Pop, MD      Allergies    Patient has no known allergies.    Review of Systems   Review of Systems  Physical Exam Updated Vital Signs BP (!) 120/58   Pulse 78   Temp 98.1 F (36.7 C)   Resp (!) 27   Ht 5\' 4"  (1.626 m)   Wt 59 kg   SpO2 98%   BMI 22.31 kg/m  Physical Exam Vitals and nursing note reviewed.  HENT:     Head: Atraumatic.  Eyes:     Pupils: Pupils are equal, round, and reactive to light.  Cardiovascular:     Rate and Rhythm: Regular rhythm.  Chest:     Chest wall: No tenderness.   Abdominal:     Tenderness: There is no abdominal tenderness.  Skin:    General: Skin is warm.  Neurological:     Mental Status: She is alert. Mental status is at baseline.     ED Results / Procedures / Treatments   Labs (all labs ordered are listed, but only abnormal results are displayed) Labs Reviewed  COMPREHENSIVE METABOLIC PANEL - Abnormal; Notable for the following components:      Result Value   Glucose, Bld 135 (*)    Creatinine, Ser 1.09 (*)    Total Protein 5.8 (*)    Albumin 2.9 (*)    GFR, Estimated 48 (*)    Anion gap 4 (*)    All other components within normal limits  CBC - Abnormal; Notable for the following components:   WBC 12.9 (*)    RBC 3.65 (*)    Hemoglobin 10.5 (*)    HCT 33.1 (*)    All other components within normal limits  CBG MONITORING, ED - Abnormal; Notable for the following components:   Glucose-Capillary 103 (*)    All other components within normal limits  POC OCCULT BLOOD, ED    EKG EKG Interpretation  Date/Time:  Monday November 29 2022 14:59:07 EST Ventricular Rate:  75 PR Interval:  166 QRS Duration: 84 QT Interval:  357 QTC Calculation: 399 R Axis:   -19 Text Interpretation: Sinus rhythm Borderline left axis deviation Abnormal R-wave progression, early transition Confirmed by 02-13-2002 [phone]) on 11/29/2022 3:51:18 PM  Radiology DG Chest Portable 1 View  Result Date: 11/29/2022 CLINICAL DATA:  Weakness.  Altered mental status. EXAM: PORTABLE CHEST 1 VIEW COMPARISON:  11/17/2022 x-ray.  CT 11/20/2022.  Older exams as well. FINDINGS: Stable cardiopericardial silhouette. Calcified aorta. Enlarged central pulmonary arteries. No consolidation, pneumothorax or effusion. No edema. Overlapping cardiac leads. IMPRESSION: No acute cardiopulmonary disease.  Enlarged pulmonary arteries. Electronically Signed   By: 11/22/2022 M.D.   On: 11/29/2022 18:05   CT HEAD WO CONTRAST (12/01/2022)  Result Date: 11/29/2022 CLINICAL DATA:  Mental  status change EXAM: CT HEAD WITHOUT CONTRAST TECHNIQUE: Contiguous axial images were obtained from the base of the skull through the vertex without intravenous contrast. RADIATION DOSE REDUCTION: This exam was performed according to the departmental dose-optimization program which includes automated exposure control, adjustment of the mA and/or kV according to patient size and/or use of iterative reconstruction technique. COMPARISON:  MRI head 12/16/2015 FINDINGS: Brain: No evidence of acute infarction, hemorrhage, hydrocephalus, extra-axial collection or mass lesion/mass effect. Again seen is mild diffuse atrophy. There is mild periventricular white  matter hypodensity, likely chronic small vessel ischemic change. 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 Calcified extra-axial lesion in the left frontal region measures 12 by 6 mm most compatible with calcified meningioma. This is unchanged. Vascular: Atherosclerotic calcifications are present within the cavernous internal carotid arteries. Skull: Normal. Negative for fracture or focal lesion. Sinuses/Orbits: No acute finding. Other: None. IMPRESSION: 1. No acute intracranial process. 2. Stable atrophy and chronic small vessel ischemic changes. Electronically Signed   By: Ronney Asters M.D.   On: 11/29/2022 17:00    Procedures Procedures    Medications Ordered in ED Medications  sodium chloride 0.9 % bolus 500 mL (0 mLs Intravenous Stopped 11/29/22 1940)    ED Course/ Medical Decision Making/ A&P                             Medical Decision Making Amount and/or Complexity of Data Reviewed Labs: ordered. Radiology: ordered.   Patient presents with mental status change.  Reportedly had a unresponsive episode at home.  Patient states she just went out does not really know what happened.  Feeling back to baseline now.  Reportedly has had episodes like this before.  States she has not eaten much today.  Has had previous workup  around 6 years ago for syncope.  Will get basic blood work to evaluate causes such as electrolyte abnormality.  Will get head CT.  Will get EKG also. CT head reassuring.  Creatinine just slightly elevated.  Has a mild persistent tachycardia.  Does not appear dyspneic.  Hemoglobin had to decrease.  Guaiac negative.  Chest x-ray reassuring.  Feels better after fluids.  Blood pressure improved.  Will discharge home.        Final Clinical Impression(s) / ED Diagnoses Final diagnoses:  Syncope, unspecified syncope type    Rx / DC Orders ED Discharge Orders     None         Davonna Belling, MD 11/29/22 2256

## 2022-11-29 NOTE — ED Triage Notes (Signed)
Pt via EMS from [facility] with facility staff reporting AMS. Pt was unresponsive on EMS arrival with GCS of 3; staff state pt has had transient episodes with similar symptoms over the past few days. No response to sternal rub but pt was breathing normally. During EMS assessment, pt "woke up" and began responding appropriately and has returned to baseline per staff. GCS currently 15. Hx dementia, DM2, HTN. Pt is a DNR with MOST form.   BP 113/47 Cbg 171 HR 74 O2 96% on 3L baseline requirement RR 16 EKG unremarkable  Pt is currently awake, alert, and responsive.

## 2022-11-29 NOTE — Discharge Instructions (Signed)
there was mild anemia today.  Follow-up with her doctor.  Also discussed with Dr. Doylene Canard about potential adjusting adjustments of her medications since her blood pressure is mildly low.

## 2022-12-09 DEATH — deceased
